# Patient Record
Sex: Female | Born: 1975 | Race: Black or African American | Hispanic: No | Marital: Married | Smoking: Former smoker
Health system: Southern US, Community
[De-identification: ages and names within clinical notes are randomized; demographics above are authoritative.]

## PROBLEM LIST (undated history)

## (undated) DIAGNOSIS — F419 Anxiety disorder, unspecified: Secondary | ICD-10-CM

## (undated) DIAGNOSIS — F329 Major depressive disorder, single episode, unspecified: Secondary | ICD-10-CM

## (undated) DIAGNOSIS — F32A Depression, unspecified: Secondary | ICD-10-CM

## (undated) DIAGNOSIS — D649 Anemia, unspecified: Secondary | ICD-10-CM

## (undated) DIAGNOSIS — O09529 Supervision of elderly multigravida, unspecified trimester: Secondary | ICD-10-CM

## (undated) HISTORY — DX: Major depressive disorder, single episode, unspecified: F32.9

## (undated) HISTORY — PX: THERAPEUTIC ABORTION: SHX798

## (undated) HISTORY — PX: DILATION AND CURETTAGE OF UTERUS: SHX78

## (undated) HISTORY — DX: Depression, unspecified: F32.A

## (undated) HISTORY — PX: WISDOM TOOTH EXTRACTION: SHX21

## (undated) HISTORY — DX: Anemia, unspecified: D64.9

## (undated) HISTORY — DX: Anxiety disorder, unspecified: F41.9

---

## 1998-11-07 ENCOUNTER — Other Ambulatory Visit: Admission: RE | Admit: 1998-11-07 | Discharge: 1998-11-07 | Payer: Self-pay | Admitting: Obstetrics and Gynecology

## 1999-04-30 ENCOUNTER — Inpatient Hospital Stay (HOSPITAL_COMMUNITY): Admission: AD | Admit: 1999-04-30 | Discharge: 1999-04-30 | Payer: Self-pay | Admitting: Obstetrics & Gynecology

## 1999-04-30 ENCOUNTER — Inpatient Hospital Stay (HOSPITAL_COMMUNITY): Admission: AD | Admit: 1999-04-30 | Discharge: 1999-04-30 | Payer: Self-pay | Admitting: *Deleted

## 1999-05-01 ENCOUNTER — Inpatient Hospital Stay (HOSPITAL_COMMUNITY): Admission: AD | Admit: 1999-05-01 | Discharge: 1999-05-04 | Payer: Self-pay | Admitting: Obstetrics & Gynecology

## 1999-11-08 ENCOUNTER — Emergency Department (HOSPITAL_COMMUNITY): Admission: EM | Admit: 1999-11-08 | Discharge: 1999-11-08 | Payer: Self-pay | Admitting: Emergency Medicine

## 2000-04-07 ENCOUNTER — Emergency Department (HOSPITAL_COMMUNITY): Admission: EM | Admit: 2000-04-07 | Discharge: 2000-04-07 | Payer: Self-pay | Admitting: Emergency Medicine

## 2000-04-25 ENCOUNTER — Other Ambulatory Visit: Admission: RE | Admit: 2000-04-25 | Discharge: 2000-04-25 | Payer: Self-pay | Admitting: Obstetrics

## 2000-04-25 ENCOUNTER — Encounter (INDEPENDENT_AMBULATORY_CARE_PROVIDER_SITE_OTHER): Payer: Self-pay

## 2000-07-28 ENCOUNTER — Emergency Department (HOSPITAL_COMMUNITY): Admission: EM | Admit: 2000-07-28 | Discharge: 2000-07-28 | Payer: Self-pay | Admitting: Emergency Medicine

## 2001-04-03 ENCOUNTER — Emergency Department (HOSPITAL_COMMUNITY): Admission: EM | Admit: 2001-04-03 | Discharge: 2001-04-04 | Payer: Self-pay | Admitting: Emergency Medicine

## 2002-01-01 ENCOUNTER — Ambulatory Visit (HOSPITAL_COMMUNITY): Admission: RE | Admit: 2002-01-01 | Discharge: 2002-01-01 | Payer: Self-pay | Admitting: Obstetrics and Gynecology

## 2002-01-01 ENCOUNTER — Encounter: Payer: Self-pay | Admitting: Obstetrics and Gynecology

## 2002-04-21 ENCOUNTER — Inpatient Hospital Stay (HOSPITAL_COMMUNITY): Admission: AD | Admit: 2002-04-21 | Discharge: 2002-04-24 | Payer: Self-pay | Admitting: Obstetrics and Gynecology

## 2003-01-17 ENCOUNTER — Other Ambulatory Visit: Admission: RE | Admit: 2003-01-17 | Discharge: 2003-01-17 | Payer: Self-pay | Admitting: Obstetrics and Gynecology

## 2003-11-22 ENCOUNTER — Emergency Department (HOSPITAL_COMMUNITY): Admission: EM | Admit: 2003-11-22 | Discharge: 2003-11-22 | Payer: Self-pay | Admitting: Family Medicine

## 2004-01-27 ENCOUNTER — Other Ambulatory Visit: Admission: RE | Admit: 2004-01-27 | Discharge: 2004-01-27 | Payer: Self-pay | Admitting: Obstetrics and Gynecology

## 2004-09-21 ENCOUNTER — Emergency Department (HOSPITAL_COMMUNITY): Admission: EM | Admit: 2004-09-21 | Discharge: 2004-09-21 | Payer: Self-pay | Admitting: *Deleted

## 2005-01-29 ENCOUNTER — Other Ambulatory Visit: Admission: RE | Admit: 2005-01-29 | Discharge: 2005-01-29 | Payer: Self-pay | Admitting: Obstetrics and Gynecology

## 2006-01-09 ENCOUNTER — Other Ambulatory Visit: Admission: RE | Admit: 2006-01-09 | Discharge: 2006-01-09 | Payer: Self-pay | Admitting: Obstetrics and Gynecology

## 2009-04-13 ENCOUNTER — Encounter: Admission: RE | Admit: 2009-04-13 | Discharge: 2009-04-13 | Payer: Self-pay | Admitting: Internal Medicine

## 2010-08-10 ENCOUNTER — Inpatient Hospital Stay (HOSPITAL_COMMUNITY): Admission: AD | Admit: 2010-08-10 | Discharge: 2010-08-11 | Payer: Self-pay | Admitting: Obstetrics and Gynecology

## 2010-12-18 LAB — POCT PREGNANCY, URINE: Preg Test, Ur: NEGATIVE

## 2010-12-18 LAB — CBC
HCT: 32.4 % — ABNORMAL LOW (ref 36.0–46.0)
Hemoglobin: 10.9 g/dL — ABNORMAL LOW (ref 12.0–15.0)
MCH: 29.6 pg (ref 26.0–34.0)
MCHC: 33.5 g/dL (ref 30.0–36.0)

## 2010-12-18 LAB — GC/CHLAMYDIA PROBE AMP, GENITAL
Chlamydia, DNA Probe: NEGATIVE
GC Probe Amp, Genital: NEGATIVE

## 2010-12-18 LAB — WET PREP, GENITAL

## 2011-02-22 NOTE — Op Note (Signed)
[facility]  Patient:    Rhonda Shannon, Rhonda Shannon Visit Number: [id number] MRN: [id number]          Service Type: OBS Location: 910A 9101 01 Attending Physician:  Leonard Schwartz Dictated by:   Janine Limbo, M.D. Proc. Date: 04/21/02 Admit Date:  04/21/2002                             Operative Report  PREOPERATIVE DIAGNOSES:       1. Term intrauterine pregnancy.                               2. Prior cesarean section.                               3. Desires repeat cesarean section.  POSTOPERATIVE DIAGNOSES:      1. Term intrauterine pregnancy.                               2. Prior cesarean section.                               3. Desires repeat cesarean section.  PROCEDURE:                    Repeat low transverse cesarean section.  SURGEON:                      Janine Limbo, M.D.  FIRST ASSISTANT:              Henreitta Leber, P.A.  ANESTHESIA:                   Spinal.  DISPOSITION:                  Ms. Montez Morita is a 35 year old female gravida 4, para 1-0-2-1 who presents at [redacted] weeks gestation for repeat cesarean section. She understands the indications for her procedure and she accepts the risks of, but not limited to, anesthetic complications, bleeding, infections, and possible damage to the surrounding organs.  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  The Apgars were 9 at one minute and 9 at five minutes.  There were two nuchal cords present.  The uterus, fallopian tubes, and ovaries were normal for the gravid state.  PROCEDURE:                    The patient was taken to the operating room where a spinal anesthetic was given.  The patients abdomen and perineum were prepped with multiple layers of Betadine.  A Foley catheter was placed.  The patient was sterilely draped.  A low transverse incision was made in the abdomen and carried sharply  through the subcutaneous tissue, the fascia, and the anterior peritoneum.  An incision was made in the lower uterine segment and extended transversely.  The fetal head was delivered without difficulty. The mouth and nose were suctioned.  The nuchal cord was clamped and cut.  The remainder of the  infant was delivered.  Routine cord blood studies were obtained.  The infant was handed to the waiting pediatric team.  The placenta was removed.  The uterine cavity was cleaned of amniotic fluid, clotted blood, and membranes.  The uterine incision was closed using a running locking suture of 2-0 Vicryl.  The pericolonic gutters were cleaned of amniotic fluid and clotted blood.  Hemostasis was adequate.  The anterior peritoneum and the abdominal musculature were reapproximated in the midline using 2-0 Vicryl. The subcutaneous layer and the fascia were irrigated.  Hemostasis was adequate.  The fascia was closed using a running suture of 0 Vicryl followed by three interrupted sutures of 0 Vicryl.  The subcutaneous layer was irrigated and hemostasis was adequate.  The skin was reapproximated using skin staples.  Sponge, needle, and instrument counts were correct on two occasions. The estimated blood loss was 700 cc.  The patient tolerated her procedure well.  The infant was taken to the full-term nursery in stable condition.  The patient was taken to the recovery room in stable condition. Dictated by:   Janine Limbo, M.D. Attending Physician:  Leonard Schwartz DD:  04/21/02 TD:  04/23/02 Job: [phone] [id number]

## 2011-02-22 NOTE — H&P (Signed)
[facility]  Patient:    Rhonda Shannon, Rhonda Shannon Visit Number: [id number] MRN: [id number]          Service Type: OBS Location: 910B 9198 01 Attending Physician:  Leonard Schwartz Dictated by:   Janine Limbo, M.D. Admit Date:  04/21/2002                           History and Physical  HISTORY OF PRESENT ILLNESS:   The patient is a 35 year old female, gravida 4, para 1-0-2-1, who presents at [redacted] weeks gestation (EDC is May 06, 2001) for repeat cesarean section.  The patient has been followed at [facility] Gynecology for this pregnancy that has been largely uncomplicated.  She did have a prior cesarean section in 2000 where she delivered an 8 pound, 6 ounce female infant at 41-1/[redacted] weeks gestation.  She had a failed vacuum extraction at that time.  OBSTETRICAL HISTORY;          The patient had a cesarean section in July 2000 and she had elective pregnancy terminations in 1997 and in 1998.  DRUG ALLERGIES:               None known.  PAST MEDICAL HISTORY:         The patient denies hypertension and diabetes. She does have a history of anemia, however.  SOCIAL HISTORY:               The patient denies cigarette use, alcohol use, and recreational drug use.  REVIEW OF SYSTEMS:            Normal pregnancy complaints.  FAMILY HISTORY:               The patient has a history of albinism in five relatives.  Her mother has thalassemia trait.  She has two grandmothers with non-insulin-dependent diabetes.  PHYSICAL EXAMINATION:  VITAL SIGNS:                  Weight is 139 pounds.  HEENT:                        Within normal limits.  CHEST:                        Clear.  HEART:                        Regular rate and rhythm.  BREASTS:                      Without masses.  ABDOMEN:                      Gravid with a fundal height of 36 cm.  EXTREMITIES:                  Within normal limits.  NEUROLOGIC:                    Exam is grossly normal.  PELVIC:                       Her cervix is closed and long.  LABORATORY VALUES:            Blood type is A positive, antibody screen negative.  VDRL is nonreactive.  Rubella is immune.  HBsAg negative.  HIV is nonreactive.  GC negative.  Chlamydia negative.  Pap within normal limits. Alpha-fetoprotein within normal limits.  Glucola screen is 77. Third-trimester beta strep is positive.  ASSESSMENT:                   1. A 38-week gestation.                               2. Prior cesarean section.                               3. Desires repeat cesarean section.  PLAN:                         The patient will undergo a repeat low transverse cesarean section.  She understands the indications for her procedure and she accepts the risks of, but not limited to, anesthetic complications, bleeding, infections, and possible damage to the surrounding organs. Dictated by:   Janine Limbo, M.D. Attending Physician:  Leonard Schwartz DD:  04/20/02 TD:  04/20/02 Job: [id number]

## 2011-02-22 NOTE — Discharge Summary (Signed)
[facility]  Patient:    Rhonda Shannon, Rhonda Shannon Visit Number: [id number] MRN: [id number]          Service Type: OBS Location: 910A 9101 01 Attending Physician:  Leonard Schwartz Dictated by:   Wynelle Bourgeois, CNM Admit Date:  04/21/2002 Discharge Date: 04/24/2002                             Discharge Summary  ADMISSION DIAGNOSES:          1. Intrauterine pregnancy at 38 weeks.                               2. Previous cesarean section, desires                                  repeat cesarean section.  DISCHARGE DIAGNOSES:          1. Intrauterine pregnancy at 38 weeks.                               2. Previous cesarean section, desires                                  repeat cesarean section.                               3. Status post low transverse cesarean section                                  of a 7 pound 10 ounce female, named [place],                                  Apgars 9 and 9 with nuchal cord x2.                               4. Anemia without hemodynamic instability.  HOSPITAL PROCEDURES:          1. Spinal anesthesia.                               2. Repeat low transverse cesarean section.  HOSPITAL COURSE:              The patient was admitted on April 21, 2002, for an elective repeat low transverse cesarean section and was delivered successfully of a viable female infant with two nuchal cords who was vigorous upon birth.  Surgery went without incident with an EBL of 700 cc. Postoperative day #1, the patient was doing well, ambulating and eating without difficulty.  She had chosen to bottle feed her infant.  She was planning and IUD for contraception.  Vital signs are stable.  Hemoglobin was 7.1, but the patient was without syncope or dizziness.  Abdomen was soft and nontender.  Dressing was clean, dry and intact and the patient continued with routine postoperative care on days #1, 2  and 3.  On postoperative day #2, a CBC  was repeated which returned a hemoglobin of 7.9 which was an improvement. The patient continued to be up and around with stable vital signs and no evidence of syncope or dizziness.  On postoperative day #3, she continued to be stable.  Physical exam was within normal limits.  Abdomen was soft and appropriately tender.  Incision was clean and dry with staples.  Lochia was small.  Extremities within normal limits.  She was deemed to have received the full benefit of her hospital stay and was discharged home.  DISCHARGE LABORATORY DATA:    White blood cell count 7.3, hemoglobin 7.9, hematocrit 24.2, platelets 189,000.  RPR nonreactive.  DISCHARGE MEDICATIONS:        1. Tylox one to two p.o. q.4h. p.r.n.                               2. Motrin 600 mg p.o. q.6h. p.r.n.                               3. Iron sulfate one p.o. b.i.d.  DISCHARGE INSTRUCTIONS:       Per [facility] handout.  DISCHARGE FOLLOWUP:           Six weeks or p.r.n. Dictated by:   Wynelle Bourgeois, CNM Attending Physician:  Leonard Schwartz DD:  04/14/02 TD:  04/29/02 Job: [id number]

## 2011-11-28 ENCOUNTER — Ambulatory Visit (INDEPENDENT_AMBULATORY_CARE_PROVIDER_SITE_OTHER): Payer: BC Managed Care – PPO | Admitting: Emergency Medicine

## 2011-11-28 DIAGNOSIS — R1110 Vomiting, unspecified: Secondary | ICD-10-CM

## 2011-11-28 DIAGNOSIS — R109 Unspecified abdominal pain: Secondary | ICD-10-CM

## 2011-11-28 LAB — POCT CBC
Granulocyte percent: 62.5 %G (ref 37–80)
HCT, POC: 37.1 % — AB (ref 37.7–47.9)
MCV: 87.2 fL (ref 80–97)
Platelet Count, POC: 246 10*3/uL (ref 142–424)
RBC: 4.25 M/uL (ref 4.04–5.48)

## 2011-11-28 LAB — POCT URINALYSIS DIPSTICK
Ketones, UA: NEGATIVE
Spec Grav, UA: >=1.03
pH, UA: 5

## 2011-11-28 LAB — POCT UA - MICROSCOPIC ONLY
Casts, Ur, LPF, POC: NEGATIVE
Yeast, UA: NEGATIVE

## 2011-11-28 MED ORDER — ONDANSETRON 8 MG PO TBDP: 8.0000 mg | ORAL_TABLET | Freq: Three times a day (TID) | ORAL | Status: AC | PRN

## 2011-11-28 NOTE — Patient Instructions (Signed)
Nausea and Vomiting Nausea is a sick feeling that often comes before throwing up (vomiting). Vomiting is a reflex where stomach contents come out of your mouth. Vomiting can cause severe loss of body fluids (dehydration). Children and elderly adults can become dehydrated quickly, especially if they also have diarrhea. Nausea and vomiting are symptoms of a condition or disease. It is important to find the cause of your symptoms. CAUSES   Direct irritation of the stomach lining. This irritation can result from increased acid production (gastroesophageal reflux disease), infection, food poisoning, taking certain medicines (such as nonsteroidal anti-inflammatory drugs), alcohol use, or tobacco use.   Signals from the brain.These signals could be caused by a headache, heat exposure, an inner ear disturbance, increased pressure in the brain from injury, infection, a tumor, or a concussion, pain, emotional stimulus, or metabolic problems.   An obstruction in the gastrointestinal tract (bowel obstruction).   Illnesses such as diabetes, hepatitis, gallbladder problems, appendicitis, kidney problems, cancer, sepsis, atypical symptoms of a heart attack, or eating disorders.   Medical treatments such as chemotherapy and radiation.   Receiving medicine that makes you sleep (general anesthetic) during surgery.  DIAGNOSIS Your caregiver may ask for tests to be done if the problems do not improve after a few days. Tests may also be done if symptoms are severe or if the reason for the nausea and vomiting is not clear. Tests may include:  Urine tests.   Blood tests.   Stool tests.   Cultures (to look for evidence of infection).   X-rays or other imaging studies.  Test results can help your caregiver make decisions about treatment or the need for additional tests. TREATMENT You need to stay well hydrated. Drink frequently but in small amounts.You may wish to drink water, sports drinks, clear broth, or  eat frozen ice pops or gelatin dessert to help stay hydrated.When you eat, eating slowly may help prevent nausea.There are also some antinausea medicines that may help prevent nausea. HOME CARE INSTRUCTIONS   Take all medicine as directed by your caregiver.   If you do not have an appetite, do not force yourself to eat. However, you must continue to drink fluids.   If you have an appetite, eat a normal diet unless your caregiver tells you differently.   Eat a variety of complex carbohydrates (rice, wheat, potatoes, bread), lean meats, yogurt, fruits, and vegetables.   Avoid high-fat foods because they are more difficult to digest.   Drink enough water and fluids to keep your urine clear or pale yellow.   If you are dehydrated, ask your caregiver for specific rehydration instructions. Signs of dehydration may include:   Severe thirst.   Dry lips and mouth.   Dizziness.   Dark urine.   Decreasing urine frequency and amount.   Confusion.   Rapid breathing or pulse.  SEEK IMMEDIATE MEDICAL CARE IF:   You have blood or brown flecks (like coffee grounds) in your vomit.   You have black or bloody stools.   You have a severe headache or stiff neck.   You are confused.   You have severe abdominal pain.   You have chest pain or trouble breathing.   You do not urinate at least once every 8 hours.   You develop cold or clammy skin.   You continue to vomit for longer than 24 to 48 hours.   You have a fever.  MAKE SURE YOU:   Understand these instructions.   Will watch your   condition.   Will get help right away if you are not doing well or get worse.  Document Released: 09/23/2005 Document Revised: 06/05/2011 Document Reviewed: 02/20/2011 Exit[employer] Patient Information 2012 [employer]. 

## 2011-11-28 NOTE — Progress Notes (Signed)
  Subjective:    Patient ID: Rhonda Shannon, female    DOB: 08/06/1976, 36 y.o.   MRN: [id number]  HPI patient presents with onset of nausea and vomiting yesterday. She's also had some flank and abdominal discomfort. She has had no definite fever. . The very first of the month. Has some mild burning on urination but nothing significant.    Review of Systems she has felt lightheaded and nauseated. She had one loose stool yesterday but none today.     Objective:   Physical Exam physical exam reveals an alert female who is in no acute distress. Her chest is clear to auscultation and percussion. Heart regular rate no murmurs rubs or gallops. The abdomen is flat there is tenderness to palpation in the midepigastrium and also on the right and left lower abdomen.        Assessment & Plan:  Assessment is abdominal pain nausea loose stools with flank pain urinary symptoms. He did rule out a kidney infection. This could be a GI bug.

## 2012-05-07 ENCOUNTER — Encounter: Payer: Self-pay | Admitting: Family Medicine

## 2012-05-07 ENCOUNTER — Ambulatory Visit (INDEPENDENT_AMBULATORY_CARE_PROVIDER_SITE_OTHER): Payer: BC Managed Care – PPO | Admitting: Family Medicine

## 2012-05-07 VITALS — BP 116/76 | HR 76 | Temp 97.7°F | Resp 16 | Ht 63.75 in | Wt 136.6 lb

## 2012-05-07 DIAGNOSIS — Z0000 Encounter for general adult medical examination without abnormal findings: Secondary | ICD-10-CM

## 2012-05-07 DIAGNOSIS — F988 Other specified behavioral and emotional disorders with onset usually occurring in childhood and adolescence: Secondary | ICD-10-CM

## 2012-05-07 DIAGNOSIS — M2550 Pain in unspecified joint: Secondary | ICD-10-CM

## 2012-05-07 DIAGNOSIS — Z862 Personal history of diseases of the blood and blood-forming organs and certain disorders involving the immune mechanism: Secondary | ICD-10-CM

## 2012-05-07 DIAGNOSIS — Z124 Encounter for screening for malignant neoplasm of cervix: Secondary | ICD-10-CM

## 2012-05-07 DIAGNOSIS — Z8659 Personal history of other mental and behavioral disorders: Secondary | ICD-10-CM

## 2012-05-07 DIAGNOSIS — E559 Vitamin D deficiency, unspecified: Secondary | ICD-10-CM

## 2012-05-07 DIAGNOSIS — Z87891 Personal history of nicotine dependence: Secondary | ICD-10-CM

## 2012-05-07 DIAGNOSIS — Z01419 Encounter for gynecological examination (general) (routine) without abnormal findings: Secondary | ICD-10-CM

## 2012-05-07 LAB — BASIC METABOLIC PANEL
Calcium: 9.7 mg/dL (ref 8.4–10.5)
Potassium: 4.3 mEq/L (ref 3.5–5.3)
Sodium: 137 mEq/L (ref 135–145)

## 2012-05-07 LAB — CBC WITH DIFFERENTIAL/PLATELET
Basophils Absolute: 0 10*3/uL (ref 0.0–0.1)
Basophils Relative: 0 % (ref 0–1)
Hemoglobin: 11.1 g/dL — ABNORMAL LOW (ref 12.0–15.0)
MCHC: 33.4 g/dL (ref 30.0–36.0)
Monocytes Relative: 6 % (ref 3–12)
Neutro Abs: 1.6 10*3/uL — ABNORMAL LOW (ref 1.7–7.7)
Neutrophils Relative %: 52 % (ref 43–77)
Platelets: 267 10*3/uL (ref 150–400)

## 2012-05-07 LAB — POCT URINALYSIS DIPSTICK
Bilirubin, UA: NEGATIVE
Ketones, UA: NEGATIVE
Nitrite, UA: NEGATIVE
Protein, UA: NEGATIVE

## 2012-05-07 NOTE — Progress Notes (Signed)
Subjective:    Patient ID: Rhonda Shannon, female    DOB: 09-Jun-1976, 36 y.o.   MRN: [id number]  HPI This 36 y.o. AA female is here for CPE/PAP. She has been treated for anxiety and depression  in the 2005- 2006 but these are not activity problems. She is concerned that she may have ADHD.  She recalls "struggling a little" through college and has problems with concentration. She has to  make to-do lists and often procrastinates. She has a child who has ADHD.  (She will return for another visit to address this issue more thoroughly).    She is a Runner, broadcasting/film/video and is a single, monogamous heterosexual female whose partner is HSV II + and  takes "medication". She quit smoking in 2011 and consumes alcohol 2-3x per week. She exercises  regularly.   Last PAP: Nov 2011(normal)  Last eye exam: July 2013    Review of Systems  Constitutional: Negative.   Eyes: Negative.   Respiratory: Negative.   Cardiovascular: Negative.   Gastrointestinal: Negative.   Genitourinary: Positive for urgency.  Musculoskeletal: Positive for arthralgias and gait problem.       Pain in knees  Skin: Positive for rash.       Scaly rash on elbows only- Psoriasis?  Neurological: Positive for headaches.  Hematological: Negative.   Psychiatric/Behavioral: Positive for confusion and decreased concentration. The patient is hyperactive.        Confusion-forgetful       Objective:   Physical Exam  Nursing note and vitals reviewed. Constitutional: She is oriented to person, place, and time. She appears well-developed and well-nourished. No distress.  HENT:  Head: Normocephalic and atraumatic.  Right Ear: Hearing, tympanic membrane, external ear and ear canal normal.  Left Ear: Hearing, tympanic membrane, external ear and ear canal normal.  Nose: Nose normal. No nasal deformity or septal deviation.  Mouth/Throat: Uvula is midline, oropharynx is clear and moist and mucous membranes are normal. No oral lesions. Normal  dentition. No dental caries.  Eyes: Conjunctivae are normal. Pupils are equal, round, and reactive to light. No scleral icterus.  Neck: Normal range of motion. Neck supple. No thyromegaly present.  Cardiovascular: Normal rate and regular rhythm.  Exam reveals no gallop and no friction rub.   No murmur heard. Pulmonary/Chest: Effort normal and breath sounds normal. No respiratory distress.  Abdominal: Soft. Normal appearance and bowel sounds are normal. She exhibits no mass. There is no hepatosplenomegaly. There is no tenderness. There is no guarding and no CVA tenderness.  Genitourinary: Vagina normal and uterus normal. Rectal exam shows no external hemorrhoid. No breast swelling, tenderness, discharge or bleeding. There is no rash, tenderness or lesion on the right labia. There is no rash, tenderness or lesion on the left labia. Cervix exhibits no motion tenderness, no discharge and no friability. Right adnexum displays no mass, no tenderness and no fullness. Left adnexum displays no mass, no tenderness and no fullness. No erythema, tenderness or bleeding around the vagina. No vaginal discharge found.  Musculoskeletal: Normal range of motion. She exhibits no edema and no tenderness.  Lymphadenopathy:    She has no cervical adenopathy.       Right: No inguinal adenopathy present.       Left: No inguinal adenopathy present.  Neurological: She is alert and oriented to person, place, and time. She has normal reflexes. No cranial nerve deficit. She exhibits normal muscle tone. Coordination normal.  Skin: Skin is warm and dry. Rash noted. No erythema.  Elbows- mild scaling without significant discoloration  Psychiatric: She has a normal mood and affect. Her behavior is normal. Judgment and thought content normal.         Assessment & Plan:   1. Routine general medical examination at a health care facility  POCT urinalysis dipstick  2. Unspecified vitamin D deficiency  Vitamin D, 25-hydroxy,  Basic metabolic panel  3. History of anemia  CBC with Differential  4. Encounter for cervical Pap smear with pelvic exam  Pap IG, CT/NG w/ reflex HPV when ASC-U  5. Joint pain - exam negative today Basic metabolic panel  6. ADD (attention deficit disorder)  Pt to RTC for visit to evaluate further    Re: rash on elbows- pt advised to use OTC cortisone product or ask Pharmacist to recommend an appropriate product.

## 2012-05-07 NOTE — Patient Instructions (Signed)
Keeping You Healthy  Get These Tests 1. Blood Pressure- Have your blood pressure checked once a year by your health care provider.  Normal blood pressure is 120/80. 2. Weight- Have your body mass index (BMI) calculated to screen for obesity.  BMI is measure of body fat based on height and weight.  You can also calculate your own BMI at https://www.west-esparza.com/. 3. Cholesterol- Have your cholesterol checked every 5 years starting at age 35 then yearly starting at age 29. 4. Chlamydia, HIV, and other sexually transmitted diseases- Get screened every year until age 4, then within three months of each new sexual provider. 5. Pap Smear- Every 1-3 years; discuss with your health care provider. 6. Mammogram- Every year starting at age 45  Take these medicines  Calcium with Vitamin D-Your body needs 1200 mg of Calcium each day and [phone] IU of Vitamin D daily.  Your body can only absorb 500 mg of Calcium at a time so Calcium must be taken in 2 or 3 divided doses throughout the day.  Multivitamin with folic acid- Once daily if it is possible for you to become pregnant.  Get these Immunizations  Gardasil-Series of three doses; prevents HPV related illness such as genital warts and cervical cancer.  Menactra-Single dose; prevents meningitis.  Tetanus shot- Every 10 years.   Check to see if you have had Tdap.  Flu shot-Every year.  Take these steps 1. Do not smoke-Your healthcare provider can help you quit.  For tips on how to quit go to www.smokefree.gov or call 1-800 QUITNOW. 2. Be physically active- Exercise 5 days a week for at least 30 minutes.  If you are not already physically active, start slow and gradually work up to 30 minutes of moderate physical activity.  Examples of moderate activity include walking briskly, dancing, swimming, bicycling, etc. 3. Breast Cancer- A self breast exam every month is important for early detection of breast cancer.  For more information and instruction on  self breast exams, ask your healthcare provider or [place]. 4. Eat a healthy diet- Eat a variety of healthy foods such as fruits, vegetables, whole grains, low fat milk, low fat cheeses, yogurt, lean meats, poultry and fish, beans, nuts, tofu, etc.  For more information go to www. Thenutritionsource.org 5. Drink alcohol in moderation- Limit alcohol intake to one drink or less per day. Never drink and drive. 6. Depression- Your emotional health is as important as your physical health.  If you're feeling down or losing interest in things you normally enjoy please talk to your healthcare provider about being screened for depression. 7. Dental visit- Brush and floss your teeth twice daily; visit your dentist twice a year. 8. Eye doctor- Get an eye exam at least every 2 years. 9. Helmet use- Always wear a helmet when riding a bicycle, motorcycle, rollerblading or skateboarding. 10. Safe sex- If you may be exposed to sexually transmitted infections, use a condom. 11. Seat belts- Seat belts can save your live; always wear one. 12. Smoke/Carbon Monoxide detectors- These detectors need to be installed on the appropriate level of your home. Replace batteries at least once a year. 13. Skin cancer- When out in the sun please cover up and use sunscreen 15 SPF or higher. 14. Violence- If anyone is threatening or hurting you, please tell your healthcare provider.

## 2012-05-08 LAB — VITAMIN D 25 HYDROXY (VIT D DEFICIENCY, FRACTURES): Vit D, 25-Hydroxy: 25 ng/mL — ABNORMAL LOW (ref 30–89)

## 2012-05-11 ENCOUNTER — Encounter: Payer: Self-pay | Admitting: Family Medicine

## 2012-05-11 DIAGNOSIS — Z8659 Personal history of other mental and behavioral disorders: Secondary | ICD-10-CM | POA: Insufficient documentation

## 2012-05-11 DIAGNOSIS — E559 Vitamin D deficiency, unspecified: Secondary | ICD-10-CM | POA: Insufficient documentation

## 2012-05-11 DIAGNOSIS — Z87891 Personal history of nicotine dependence: Secondary | ICD-10-CM | POA: Insufficient documentation

## 2012-05-11 DIAGNOSIS — Z862 Personal history of diseases of the blood and blood-forming organs and certain disorders involving the immune mechanism: Secondary | ICD-10-CM | POA: Insufficient documentation

## 2012-05-11 LAB — PAP IG, CT-NG, RFX HPV ASCU: Chlamydia Probe Amp: NEGATIVE

## 2012-05-11 NOTE — Progress Notes (Signed)
Quick Note:  Please call pt and advise that the following labs are abnormal... Your Vitamin D level is below normal; get over-the-counter Vit D3 2000 IU and take 1 capsule daily. Also try to eat more Vitamin D-rich foods and get some sun exposure most days of the week You are mildly anemic so a daily multivitamin with iron once a day with largest meal would be helpful.  Your PAP is normal/ negative and STD results are negative. Your blood sugar and kidney tests are all normal.  Copy to pt. ______

## 2012-05-27 ENCOUNTER — Ambulatory Visit: Payer: BC Managed Care – PPO | Admitting: Family Medicine

## 2012-07-10 ENCOUNTER — Ambulatory Visit (INDEPENDENT_AMBULATORY_CARE_PROVIDER_SITE_OTHER): Payer: BC Managed Care – PPO | Admitting: Physician Assistant

## 2012-07-10 VITALS — BP 144/90 | HR 72 | Temp 98.3°F | Resp 18 | Ht 64.0 in | Wt 140.0 lb

## 2012-07-10 DIAGNOSIS — N390 Urinary tract infection, site not specified: Secondary | ICD-10-CM

## 2012-07-10 DIAGNOSIS — R3915 Urgency of urination: Secondary | ICD-10-CM

## 2012-07-10 LAB — POCT URINALYSIS DIPSTICK
Blood, UA: NEGATIVE
Ketones, UA: NEGATIVE
Protein, UA: NEGATIVE
Spec Grav, UA: 1.02
Urobilinogen, UA: 0.2

## 2012-07-10 LAB — POCT UA - MICROSCOPIC ONLY
Crystals, Ur, HPF, POC: NEGATIVE
Mucus, UA: NEGATIVE
Yeast, UA: NEGATIVE

## 2012-07-10 MED ORDER — PHENAZOPYRIDINE HCL 200 MG PO TABS: 200.0000 mg | ORAL_TABLET | Freq: Three times a day (TID) | ORAL | Status: DC | PRN

## 2012-07-10 MED ORDER — NITROFURANTOIN MACROCRYSTAL 100 MG PO CAPS: 100.0000 mg | ORAL_CAPSULE | Freq: Four times a day (QID) | ORAL | Status: DC

## 2012-07-10 MED ORDER — NITROFURANTOIN MACROCRYSTAL 100 MG PO CAPS: 100.0000 mg | ORAL_CAPSULE | Freq: Two times a day (BID) | ORAL | Status: DC

## 2012-07-10 NOTE — Addendum Note (Signed)
Addended by: Morrell Riddle on: 07/10/2012 07:10 PM   Modules accepted: Orders

## 2012-07-10 NOTE — Progress Notes (Signed)
   [address], [place] [address]   Phone [phone]  Subjective:    Patient ID: Rhonda Shannon, female    DOB: [birth year], 36 y.o.   MRN: [id number]  HPI  Pt presents to clinic with 1 wk h/o urinary urgency and frequency.  Pt states she is bad about going to the bathroom when she feels the urge but this is not like her normal urge.  In fact she has had a couple of accidents this week because she has not been able to get to the bathroom quick enough.  She has no dysuria, vaginal symptoms or change in sexual partners.  Review of Systems  Constitutional: Negative for fever and chills.  Gastrointestinal: Positive for abdominal pain (pressure). Negative for nausea and diarrhea.  Genitourinary: Positive for urgency and frequency. Negative for dysuria, hematuria, vaginal discharge and vaginal pain.  Musculoskeletal: Negative for back pain.       Objective:   Physical Exam  Vitals reviewed. Constitutional: She is oriented to person, place, and time. She appears well-developed and well-nourished.  HENT:  Head: Normocephalic and atraumatic.  Right Ear: External ear normal.  Left Ear: External ear normal.  Cardiovascular: Normal rate, regular rhythm and normal heart sounds.   No murmur heard. Pulmonary/Chest: Effort normal and breath sounds normal.  Abdominal: Soft. Bowel sounds are normal. She exhibits no distension. There is tenderness (suprapubic). There is no CVA tenderness.  Neurological: She is alert and oriented to person, place, and time.  Skin: Skin is warm and dry.  Psychiatric: She has a normal mood and affect. Her behavior is normal. Judgment and thought content normal.    Results for orders placed in visit on 07/10/12  POCT URINALYSIS DIPSTICK      Component Value Range   Color, UA yellow     Clarity, UA cloudy     Glucose, UA negative     Bilirubin, UA negative     Ketones, UA negative     Spec Grav, UA 1.020     Blood, UA negative     pH, UA 7.5     Protein,  UA negative     Urobilinogen, UA 0.2     Nitrite, UA negative     Leukocytes, UA small (1+)    POCT UA - MICROSCOPIC ONLY      Component Value Range   WBC, Ur, HPF, POC 1-9     RBC, urine, microscopic 0-1     Bacteria, U Microscopic negative     Mucus, UA negative     Epithelial cells, urine per micros 0-2     Crystals, Ur, HPF, POC negative     Casts, Ur, LPF, POC negative     Yeast, UA negative           Assessment & Plan:   1. UTI (lower urinary tract infection)  nitrofurantoin (MACRODANTIN) 100 MG capsule, phenazopyridine (PYRIDIUM) 200 MG tablet, Urine culture  2. Urinary urgency  POCT urinalysis dipstick, POCT UA - Microscopic Only   Pt to push fluids.  Will send UA cx but if pt is not improved after abx she will RTC for recheck.

## 2012-07-15 LAB — URINE CULTURE

## 2012-07-17 ENCOUNTER — Encounter: Payer: Self-pay | Admitting: *Deleted

## 2014-10-25 ENCOUNTER — Ambulatory Visit (INDEPENDENT_AMBULATORY_CARE_PROVIDER_SITE_OTHER): Payer: BC Managed Care – PPO | Admitting: Family Medicine

## 2014-10-25 ENCOUNTER — Encounter: Payer: Self-pay | Admitting: Family Medicine

## 2014-10-25 VITALS — BP 120/78 | HR 79 | Temp 97.9°F | Resp 16 | Ht 63.5 in | Wt 147.2 lb

## 2014-10-25 DIAGNOSIS — Z124 Encounter for screening for malignant neoplasm of cervix: Secondary | ICD-10-CM

## 2014-10-25 DIAGNOSIS — Z862 Personal history of diseases of the blood and blood-forming organs and certain disorders involving the immune mechanism: Secondary | ICD-10-CM

## 2014-10-25 DIAGNOSIS — R350 Frequency of micturition: Secondary | ICD-10-CM

## 2014-10-25 DIAGNOSIS — Z113 Encounter for screening for infections with a predominantly sexual mode of transmission: Secondary | ICD-10-CM

## 2014-10-25 DIAGNOSIS — Z30011 Encounter for initial prescription of contraceptive pills: Secondary | ICD-10-CM

## 2014-10-25 DIAGNOSIS — Z0000 Encounter for general adult medical examination without abnormal findings: Secondary | ICD-10-CM

## 2014-10-25 DIAGNOSIS — Z1239 Encounter for other screening for malignant neoplasm of breast: Secondary | ICD-10-CM

## 2014-10-25 DIAGNOSIS — Z13220 Encounter for screening for lipoid disorders: Secondary | ICD-10-CM

## 2014-10-25 DIAGNOSIS — Z23 Encounter for immunization: Secondary | ICD-10-CM

## 2014-10-25 DIAGNOSIS — Z01419 Encounter for gynecological examination (general) (routine) without abnormal findings: Secondary | ICD-10-CM

## 2014-10-25 LAB — CBC WITH DIFFERENTIAL/PLATELET
BASOS ABS: 0 10*3/uL (ref 0.0–0.1)
BASOS PCT: 1 % (ref 0–1)
EOS ABS: 0 10*3/uL (ref 0.0–0.7)
Eosinophils Relative: 1 % (ref 0–5)
HEMATOCRIT: 35.6 % — AB (ref 36.0–46.0)
Hemoglobin: 11.6 g/dL — ABNORMAL LOW (ref 12.0–15.0)
LYMPHS ABS: 1.8 10*3/uL (ref 0.7–4.0)
Lymphocytes Relative: 43 % (ref 12–46)
MCH: 28.4 pg (ref 26.0–34.0)
MCHC: 32.6 g/dL (ref 30.0–36.0)
MCV: 87 fL (ref 78.0–100.0)
MONO ABS: 0.2 10*3/uL (ref 0.1–1.0)
MPV: 9.7 fL (ref 8.6–12.4)
Monocytes Relative: 6 % (ref 3–12)
NEUTROS ABS: 2 10*3/uL (ref 1.7–7.7)
NEUTROS PCT: 49 % (ref 43–77)
Platelets: 349 10*3/uL (ref 150–400)
RBC: 4.09 MIL/uL (ref 3.87–5.11)
RDW: 12.9 % (ref 11.5–15.5)
WBC: 4.1 10*3/uL (ref 4.0–10.5)

## 2014-10-25 LAB — COMPLETE METABOLIC PANEL WITH GFR
ALBUMIN: 4.5 g/dL (ref 3.5–5.2)
ALT: 10 U/L (ref 0–35)
AST: 15 U/L (ref 0–37)
Alkaline Phosphatase: 44 U/L (ref 39–117)
BUN: 10 mg/dL (ref 6–23)
CALCIUM: 9.6 mg/dL (ref 8.4–10.5)
CHLORIDE: 101 meq/L (ref 96–112)
CO2: 25 mEq/L (ref 19–32)
CREATININE: 0.62 mg/dL (ref 0.50–1.10)
GFR, Est African American: >89 mL/min
GFR, Est Non African American: >89 mL/min
GLUCOSE: 77 mg/dL (ref 70–99)
POTASSIUM: 4 meq/L (ref 3.5–5.3)
Sodium: 137 mEq/L (ref 135–145)
Total Bilirubin: 0.5 mg/dL (ref 0.2–1.2)
Total Protein: 7.4 g/dL (ref 6.0–8.3)

## 2014-10-25 LAB — POCT URINALYSIS DIPSTICK
Bilirubin, UA: NEGATIVE
Blood, UA: NEGATIVE
GLUCOSE UA: NEGATIVE
Ketones, UA: NEGATIVE
Leukocytes, UA: NEGATIVE
Nitrite, UA: NEGATIVE
PH UA: 5
PROTEIN UA: NEGATIVE
Spec Grav, UA: <=1.005
UROBILINOGEN UA: 0.2

## 2014-10-25 LAB — LIPID PANEL
Cholesterol: 162 mg/dL (ref 0–200)
HDL: 57 mg/dL (ref >39)
LDL Cholesterol: 92 mg/dL (ref 0–99)
TRIGLYCERIDES: 63 mg/dL (ref <150)
Total CHOL/HDL Ratio: 2.8 Ratio
VLDL: 13 mg/dL (ref 0–40)

## 2014-10-25 MED ORDER — NORGESTIM-ETH ESTRAD TRIPHASIC 0.18/0.215/0.25 MG-35 MCG PO TABS: 1.0000 | ORAL_TABLET | Freq: Every day | ORAL | Status: DC

## 2014-10-25 NOTE — Patient Instructions (Signed)
Keeping You Healthy  Get These Tests  Blood Pressure- Have your blood pressure checked once a year by your health care provider.  Normal blood pressure is 120/80.  Weight- Have your body mass index (BMI) calculated to screen for obesity.  BMI is measure of body fat based on height and weight.  You can also calculate your own BMI at https://www.west-esparza.com/www.nhlbisupport.com/bmi/.  Cholesterol- Have your cholesterol checked every 5 years starting at age 38 then yearly starting at age 38.  Chlamydia, HIV, and other sexually transmitted diseases- Get screened every year until age 38, then within three months of each new sexual provider.  Pap Smear- Every 1-3 years; discuss with your health care provider.  Mammogram- Every year starting at age 38. I am ordering a baseline mammogram [facility].   Take these medicines  Calcium with Vitamin D-Your body needs 1200 mg of Calcium each day and [phone] IU of Vitamin D daily.  Your body can only absorb 500 mg of Calcium at a time so Calcium must be taken in 2 or 3 divided doses throughout the day.  Multivitamin with folic acid- Once daily if it is possible for you to become pregnant.  Get these Immunizations  Tetanus shot- Every 10 years.  Flu shot-Every year.  Take these steps 1. Do not smoke-Your healthcare provider can help you quit.  For tips on how to quit go to www.smokefree.gov or call 1-800 QUITNOW. 2. Be physically active- Exercise 5 days a week for at least 30 minutes.  If you are not already physically active, start slow and gradually work up to 30 minutes of moderate physical activity.  Examples of moderate activity include walking briskly, dancing, swimming, bicycling, etc. 3. Breast Cancer- A self breast exam every month is important for early detection of breast cancer.  For more information and instruction on self breast exams, ask your healthcare provider or [place].eswww.womenshealth.gov/faq/breast-self-exam.cfm. 4. Eat a healthy diet-  Eat a variety of healthy foods such as fruits, vegetables, whole grains, low fat milk, low fat cheeses, yogurt, lean meats, poultry and fish, beans, nuts, tofu, etc.  For more information go to www. Thenutritionsource.org 5. Drink alcohol in moderation- Limit alcohol intake to one drink or less per day. Never drink and drive. 6. Depression- Your emotional health is as important as your physical health.  If you're feeling down or losing interest in things you normally enjoy please talk to your healthcare provider about being screened for depression. 7. Dental visit- Brush and floss your teeth twice daily; visit your dentist twice a year. 8. Eye doctor- Get an eye exam at least every 2 years. 9. Helmet use- Always wear a helmet when riding a bicycle, motorcycle, rollerblading or skateboarding. 10. Safe sex- If you may be exposed to sexually transmitted infections, use a condom. 11. Seat belts- Seat belts can save your live; always wear one. 12. Smoke/Carbon Monoxide detectors- These detectors need to be installed on the appropriate level of your home. Replace batteries at least once a year. 13. Skin cancer- When out in the sun please cover up and use sunscreen 15 SPF or higher. 14. Violence- If anyone is threatening or hurting you, please tell your healthcare provider.   Oral Contraception Information Oral contraceptive pills (OCPs) are medicines taken to prevent pregnancy. OCPs work by preventing the ovaries from releasing eggs. The hormones in OCPs also cause the cervical mucus to thicken, preventing the sperm from entering the uterus. The hormones also cause the uterine  lining to become thin, not allowing a fertilized egg to attach to the inside of the uterus. OCPs are highly effective when taken exactly as prescribed. However, OCPs do not prevent sexually transmitted diseases (STDs). Safe sex practices, such as using condoms along with the pill, can help prevent STDs.  Before taking the pill, you  may have a physical exam and Pap test. Your health care provider may order blood tests. The health care provider will make sure you are a good candidate for oral contraception. Discuss with your health care provider the possible side effects of the OCP you may be prescribed. When starting an OCP, it can take 2 to 3 months for the body to adjust to the changes in hormone levels in your body.  TYPES OF ORAL CONTRACEPTION  The combination pill--This pill contains estrogen and progestin (synthetic progesterone) hormones. The combination pill comes in 21-day, 28-day, or 91-day packs. Some types of combination pills are meant to be taken continuously (365-day pills). With 21-day packs, you do not take pills for 7 days after the last pill. With 28-day packs, the pill is taken every day. The last 7 pills are without hormones. Certain types of pills have more than 21 hormone-containing pills. With 91-day packs, the first 84 pills contain both hormones, and the last 7 pills contain no hormones or contain estrogen only.  The minipill--This pill contains the progesterone hormone only. The pill is taken every day continuously. It is very important to take the pill at the same time each day. The minipill comes in packs of 28 pills. All 28 pills contain the hormone.  ADVANTAGES OF ORAL CONTRACEPTIVE PILLS  Decreases premenstrual symptoms.   Treats menstrual period cramps.   Regulates the menstrual cycle.   Decreases a heavy menstrual flow.   May treatacne, depending on the type of pill.   Treats abnormal uterine bleeding.   Treats polycystic ovarian syndrome.   Treats endometriosis.   Can be used as emergency contraception.  THINGS THAT CAN MAKE ORAL CONTRACEPTIVE PILLS LESS EFFECTIVE OCPs can be less effective if:   You forget to take the pill at the same time every day.   You have a stomach or intestinal disease that lessens the absorption of the pill.   You take OCPs with other  medicines that make OCPs less effective, such as antibiotics, certain HIV medicines, and some seizure medicines.   You take expired OCPs.   You forget to restart the pill on day 7, when using the packs of 21 pills.  RISKS ASSOCIATED WITH ORAL CONTRACEPTIVE PILLS  Oral contraceptive pills can sometimes cause side effects, such as:  Headache.  Nausea.  Breast tenderness.  Irregular bleeding or spotting. Combination pills are also associated with a small increased risk of:  Blood clots.  Heart attack.  Stroke. Document Released: 12/14/2002 Document Revised: 07/14/2013 Document Reviewed: 03/14/2013 [facility] Patient Information 2015 [place], [place]. This information is not intended to replace advice given to you by your health care provider. Make sure you discuss any questions you have with your health care provider.

## 2014-10-25 NOTE — Progress Notes (Signed)
Subjective:    Patient ID: Rhonda Shannon, female    DOB: [birth year], 39 y.o.   MRN: [id number]  HPI This 39 y.o female is here for CPE/PAP w/ STD screening and other screening labs. She wants to start OCPs.  HCM: MMG- Needs baseline.           PAP- Aug 2013 (negative).           IMM- Current (not sure abut Tdap).           Vision- Every 1-2 years.           Dental- Periodically.  Patient Active Problem List   Diagnosis Date Noted  . Unspecified vitamin D deficiency 05/11/2012  . History of anemia 05/11/2012  . History of anxiety disorder 05/11/2012  . History of tobacco use 05/11/2012    Prior to Admission medications   Medication Sig Start Date End Date Taking? Authorizing Provider  OVER THE COUNTER MEDICATION OTC Biotin, skin, hair, nails taking everyday   Yes Historical Provider, MD  Cholecalciferol (VITAMIN D PO) Take by mouth daily.    Historical Provider, MD  Multiple Vitamin (MULTI-VITAMIN DAILY PO) Take by mouth daily.    Historical Provider, MD  Pyridoxine HCl (VITAMIN B-6 PO) Take by mouth daily.    Historical Provider, MD    History   Social History  . Marital Status: Single    Spouse Name: N/A    Number of Children: N/A  . Years of Education: N/A   Occupational History  . teacher    Social History Main Topics  . Smoking status: Former Smoker    Types: Cigarettes    Quit date: 10/07/2009  . Smokeless tobacco: Not on file  . Alcohol Use: 2.4 oz/week    4 Not specified per week     Comment: beer and wine 2x/wk  . Drug Use: No  . Sexual Activity: Yes   Other Topics Concern  . Not on file   Social History Narrative   Exercise-walking 3x/week for 30 minutes. Single. Education: [employer]. Exercise: Yes.    Family History  Problem Relation Age of Onset  . Hypertension Mother     age dx in her 21's  . Hyperlipidemia Mother   . Stroke Maternal Grandmother   . Dementia Maternal Grandmother   . Aneurysm Maternal Grandfather   . Cancer Father   .  Hypertension Father   . Diabetes Paternal Grandmother     Past Surgical History  Procedure Laterality Date  . Cesarean section  2000, 2003    Review of Systems  Constitutional: Positive for fatigue.  Eyes: Negative.   Respiratory: Negative.   Cardiovascular: Negative.   Gastrointestinal: Negative.   Endocrine: Negative.   Genitourinary: Positive for frequency and pelvic pain.       Has sharp pain localized to adnexal area about 14 days before menses; bleeding lasts 7 days. Urinary frequency attributed to increased water intake. Pt requests OCPs.  Musculoskeletal: Negative.   Skin: Negative.   Allergic/Immunologic: Negative.   Neurological: Positive for headaches.  Hematological: Negative.   Psychiatric/Behavioral: Negative.       Objective:   Physical Exam  Constitutional: She is oriented to person, place, and time. Vital signs are normal. She appears well-developed and well-nourished. No distress.  Blood pressure 120/78, pulse 79, temperature 97.9 F (36.6 C), temperature source Oral, resp. rate 16, height 5' 3.5" (1.613 m), weight 147 lb 3.2 oz (66.769 kg), last menstrual period 10/14/2014, SpO2 100 %.  HENT:  Head: Normocephalic and atraumatic.  Right Ear: Hearing, tympanic membrane, external ear and ear canal normal.  Left Ear: Hearing, tympanic membrane, external ear and ear canal normal.  Nose: Nose normal. No nasal deformity or septal deviation.  Mouth/Throat: Uvula is midline, oropharynx is clear and moist and mucous membranes are normal. No oral lesions. Normal dentition. No dental caries.  Eyes: Conjunctivae, EOM and lids are normal. Pupils are equal, round, and reactive to light. No scleral icterus.  Wears corrective lenses.  Neck: Trachea normal, normal range of motion, full passive range of motion without pain and phonation normal. Neck supple. No spinous process tenderness and no muscular tenderness present. No thyroid mass and no thyromegaly present.    Cardiovascular: Normal rate, regular rhythm, S1 normal, S2 normal, normal heart sounds, intact distal pulses and normal pulses.   No extrasystoles are present. PMI is not displaced.  Exam reveals no gallop and no friction rub.   No murmur heard. Pulmonary/Chest: Effort normal and breath sounds normal. No respiratory distress.  Abdominal: Soft. Normal appearance and bowel sounds are normal. She exhibits no distension and no mass. There is no hepatomegaly. There is no tenderness. There is no guarding and no CVA tenderness.  Genitourinary: Uterus normal. There is no rash, tenderness or lesion on the right labia. There is no rash, tenderness or lesion on the left labia. Uterus is not enlarged and not tender. Cervix exhibits no motion tenderness, no discharge and no friability. Right adnexum displays no mass, no tenderness and no fullness. Left adnexum displays no mass, no tenderness and no fullness. No erythema, tenderness or bleeding in the vagina. No foreign body around the vagina. No signs of injury around the vagina. Vaginal discharge found.  Small polyp on cervix located at 10 o'clock.  Musculoskeletal:       Cervical back: Normal.       Thoracic back: Normal.       Lumbar back: Normal.  Remainder of exam unremarkable.  Lymphadenopathy:       Head (right side): No submental, no submandibular, no tonsillar, no preauricular, no posterior auricular and no occipital adenopathy present.       Head (left side): No submental, no submandibular, no tonsillar, no preauricular, no posterior auricular and no occipital adenopathy present.    She has no cervical adenopathy.    She has no axillary adenopathy.       Right: No inguinal and no supraclavicular adenopathy present.       Left: No inguinal and no supraclavicular adenopathy present.  Neurological: She is alert and oriented to person, place, and time. She has normal strength and normal reflexes. She displays no atrophy. No cranial nerve deficit or  sensory deficit. She exhibits normal muscle tone. Coordination and gait normal.  Skin: Skin is warm, dry and intact. No ecchymosis, no lesion and no rash noted. She is not diaphoretic. No cyanosis or erythema. Nails show no clubbing.  Psychiatric: She has a normal mood and affect. Her speech is normal and behavior is normal. Judgment and thought content normal. Cognition and memory are normal.  Nursing note and vitals reviewed.   Results for orders placed or performed in visit on 10/25/14  POCT urinalysis dipstick  Result Value Ref Range   Color, UA yellow    Clarity, UA clear    Glucose, UA neg    Bilirubin, UA neg    Ketones, UA neg    Spec Grav, UA <=1.005    Blood, UA neg\  pH, UA 5.0    Protein, UA neg    Urobilinogen, UA 0.2    Nitrite, UA neg    Leukocytes, UA Negative        Assessment & Plan:  Annual physical exam  Encounter for cervical Pap smear with pelvic exam - Plan: Pap IG w/ reflex to HPV when ASC-U  Screening for STD (sexually transmitted disease) - Plan: HIV antibody, RPR, POCT urinalysis dipstick  Urinary frequency - Reassurance >> UA negative. Plan: COMPLETE METABOLIC PANEL WITH GFR, POCT urinalysis dipstick  History of anemia - Plan: CBC with Differential  Flu vaccine need - Plan: Flu Vaccine QUAD 36+ mos IM  Screening, lipid - Plan: COMPLETE METABOLIC PANEL WITH GFR, Lipid panel  Screening for breast cancer - Plan: MM Digital Screening

## 2014-10-26 ENCOUNTER — Encounter: Payer: Self-pay | Admitting: Family Medicine

## 2014-10-26 LAB — PAP IG W/ RFLX HPV ASCU

## 2014-10-26 LAB — HIV ANTIBODY (ROUTINE TESTING W REFLEX): HIV 1&2 Ab, 4th Generation: NONREACTIVE

## 2014-10-26 LAB — RPR

## 2014-10-30 NOTE — Progress Notes (Signed)
Quick Note:  Please advise pt regarding following labs... STD screening tests are negative. Metabolic panel- CMET-(salts in the blood, blood sugar, kidney and liver function tests) is normal. Lipid panel is normal. Blood counts confirm a mild anemia but, otherwise, is normal.  PAP is negative (normal).   Copy to pt. ______

## 2014-11-08 ENCOUNTER — Encounter: Payer: Self-pay | Admitting: Radiology

## 2014-11-08 ENCOUNTER — Telehealth: Payer: Self-pay

## 2014-11-08 NOTE — Telephone Encounter (Signed)
Pt needs a CB# on her lab results. I have a new # to reach her. Please advise [phone]

## 2014-11-09 NOTE — Telephone Encounter (Signed)
Notes Recorded by Rhonda MarchBarbara B McPherson, MD on 10/30/2014 at 4:56 PM Please advise pt regarding following labs... STD screening tests are negative. Metabolic panel- CMET-(salts in the blood, blood sugar, kidney and liver function tests) is normal. Lipid panel is normal. Blood counts confirm a mild anemia but, otherwise, is normal.  PAP is negative (normal).   Copy to pt.  Left message on machine to call me back.

## 2014-11-10 NOTE — Telephone Encounter (Signed)
LMOM of results

## 2014-11-10 NOTE — Telephone Encounter (Signed)
Pt called back. Notified of labs. Copy sent

## 2015-02-21 ENCOUNTER — Ambulatory Visit: Payer: BC Managed Care – PPO | Admitting: Family Medicine

## 2016-03-19 ENCOUNTER — Ambulatory Visit (INDEPENDENT_AMBULATORY_CARE_PROVIDER_SITE_OTHER): Payer: BC Managed Care – PPO | Admitting: Pulmonary Disease

## 2016-03-19 ENCOUNTER — Encounter: Payer: Self-pay | Admitting: Pulmonary Disease

## 2016-03-19 VITALS — BP 108/62 | HR 95 | Ht 64.0 in | Wt 143.4 lb

## 2016-03-19 DIAGNOSIS — Z8659 Personal history of other mental and behavioral disorders: Secondary | ICD-10-CM | POA: Diagnosis not present

## 2016-03-19 DIAGNOSIS — G478 Other sleep disorders: Secondary | ICD-10-CM | POA: Diagnosis not present

## 2016-03-19 DIAGNOSIS — G4710 Hypersomnia, unspecified: Secondary | ICD-10-CM | POA: Diagnosis not present

## 2016-03-19 NOTE — Assessment & Plan Note (Addendum)
Pt with worsening hypersomnia. As far as she rememebers, she has always been a sleepy person.   She sleeps 6-8 hrs weekdays and 10 hrs weekends. Same schedule for several yrs.  Wakes up unrefreshed. Occasional headaches in am.  Has snoring. (-) witnessed apneas. Occasional gasping and choking.  Frequent awakenings at night. Tosses and turns.   Has sleep paralysis > 3 times this year.  Has hallucinations (not sure if hypnogogic or hypnopompic) > 3 times this year.  Denies cataplexy.  (-) Abnormal behavior in sleep.   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  Gets home at 5-6 pm.   Has depression and anxiety. Both are stable.  ESS 15.  Plan :   We discussed about the diagnosis of Obstructive Sleep Apnea (OSA) and implications of untreated OSA. We discussed about CPAP and BiPaP as possible treatment options.   We will schedule the patient for a sleep study. Split night study. Hopefully, can get doen in 1-2 weeks. [place] [facility] and is symptomatic.    Patient was instructed to call the office if he/she has not heard back from the office 1-2 weeks after the sleep study.   Patient was instructed to call the office if he/she is having issues with the PAP device.   We discussed good sleep hygiene.   Patient was advised not to engage in activities requiring concentration and/or vigilance if he/she is sleepy.  Patient was advised not to drive if he/she is sleepy.

## 2016-03-19 NOTE — Assessment & Plan Note (Signed)
Has anxiety and depression. Doubt its interfering with sleep.  Not on meds.  Will observe.

## 2016-03-19 NOTE — Assessment & Plan Note (Signed)
Chronic. 3x/this year.  Likely 2/2 untreated OSA. Has hypersomnia. If not better and hypersomnia is not better, may need further work up.  (-) cataplexy.  Not on sedating meds or AD.

## 2016-03-19 NOTE — Progress Notes (Addendum)
Subjective:    Patient ID: Rhonda Shannon, female    DOB: November 14, 1975, 40 y.o.   MRN: [id number]  HPI   This is the case of Rhonda Shannon, 40 y.o. Female, who was referred by Dr. Dow Adolph  in consultation regarding possible OSA.   As you very well know, patient 15 PY smoking history, quit 5 yrs ago. Not known to have asthma or copd. Denies sinus issues. Had PNA 6 yrs ago.   Pt with worsening hypersomnia. As far as she rememebers, she has always been a sleepy person.  She sleeps 6-8 hrs weekdays and 10 hrs weekends. Same schedule for several yrs.  Wakes up unrefreshed. Occasional headaches in am.  Has snoring. (-) witnessed apneas. Occasional gasping and choking.  Frequent awakenings at night. Tosses and turns.   Has sleep paralysis > 3 times this year.  Has hallucinations (not sure if hypnogogic or hypnopompic) > 3 times this year.  Denies cataplexy.  (-) Abnormal behavior in sleep.   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  Gets home at 5-6 pm.   Has depression and anxiety. Both are stable.    Review of Systems  Constitutional: Negative.   HENT: Negative.   Eyes: Negative.   Respiratory: Negative.   Cardiovascular: Negative.   Gastrointestinal: Negative.   Endocrine: Negative.   Genitourinary: Negative.   Musculoskeletal: Negative.   Skin: Negative.   Allergic/Immunologic: Negative.   Neurological: Negative.   Hematological: Negative.   Psychiatric/Behavioral: Negative.   All other systems reviewed and are negative.  Past Medical History  Diagnosis Date  . Depression   . Anemia   . Anxiety    (-) CA, DVT  Family History  Problem Relation Age of Onset  . Hypertension Mother     age dx in her 15's  . Hyperlipidemia Mother   . Stroke Maternal Grandmother   . Dementia Maternal Grandmother   . Aneurysm Maternal Grandfather   . Cancer Father   . Hypertension Father   . Diabetes Paternal Grandmother      Past Surgical  History  Procedure Laterality Date  . Cesarean section  2000, 2003    Social History   Social History  . Marital Status: Single    Spouse Name: N/A  . Number of Children: N/A  . Years of Education: N/A   Occupational History  . teacher    Social History Main Topics  . Smoking status: Former Smoker -- 1.00 packs/day for 16 years    Types: Cigarettes    Quit date: 10/07/2010  . Smokeless tobacco: Never Used  . Alcohol Use: 2.4 oz/week    4 Standard drinks or equivalent per week     Comment: beer and wine 2x/wk  . Drug Use: No  . Sexual Activity: Yes   Other Topics Concern  . Not on file   Social History Narrative   Exercise-walking 3x/week for 30 minutes. Single. Education: [employer]. Exercise: Yes.   Lives with children 2   Teacher     No Known Allergies   Outpatient Prescriptions Prior to Visit  Medication Sig Dispense Refill  . OVER THE COUNTER MEDICATION OTC Biotin, skin, hair, nails taking everyday    . Cholecalciferol (VITAMIN D PO) Take by mouth daily. Reported on 03/19/2016    . Multiple Vitamin (MULTI-VITAMIN DAILY PO) Take by mouth daily. Reported on 03/19/2016    . Norgestimate-Ethinyl Estradiol Triphasic 0.18/0.215/0.25 MG-35 MCG tablet Take 1 tablet by mouth  daily. (Patient not taking: Reported on 03/19/2016) 1 Package 11  . Pyridoxine HCl (VITAMIN B-6 PO) Take by mouth daily. Reported on 03/19/2016     No facility-administered medications prior to visit.   No orders of the defined types were placed in this encounter.          Objective:   Physical Exam   Vitals:  Filed Vitals:   03/19/16 1358  BP: 108/62  Pulse: 95  Height: 5\' 4"  (1.626 m)  Weight: 143 lb 6.4 oz (65.046 kg)  SpO2: 99%    Constitutional/General:  Pleasant, well-nourished, well-developed, not in any distress,  Comfortably seating.  Well kempt  Body mass index is 24.6 kg/(m^2). Wt Readings from Last 3 Encounters:  03/19/16 143 lb 6.4 oz (65.046 kg)  10/25/14 147 lb 3.2 oz  (66.769 kg)  07/10/12 140 lb (63.504 kg)     HEENT: Pupils equal and reactive to light and accommodation. Anicteric sclerae. Normal nasal mucosa.   No oral  lesions,  mouth clear,  oropharynx clear, no postnasal drip. (-) Oral thrush. No dental caries.  Airway - Mallampati class III  Neck: No masses. Midline trachea. No JVD, (-) LAD. (-) bruits appreciated.  Respiratory/Chest: Grossly normal chest. (-) deformity. (-) Accessory muscle use.  Symmetric expansion. (-) Tenderness on palpation.  Resonant on percussion.  Diminished BS on both lower lung zones. (-) wheezing, crackles, rhonchi (-) egophony  Cardiovascular: Regular rate and  rhythm, heart sounds normal, no murmur or gallops, no peripheral edema  Gastrointestinal:  Normal bowel sounds. Soft, non-tender. No hepatosplenomegaly.  (-) masses.   Musculoskeletal:  Normal muscle tone. Normal gait.   Extremities: Grossly normal. (-) clubbing, cyanosis.  (-) edema  Skin: (-) rash,lesions seen.   Neurological/Psychiatric : alert, oriented to time, place, person. Normal mood and affect           Assessment & Plan:  Hypersomnia Pt with worsening hypersomnia. As far as she rememebers, she has always been a sleepy person.   She sleeps 6-8 hrs weekdays and 10 hrs weekends. Same schedule for several yrs.  Wakes up unrefreshed. Occasional headaches in am.  Has snoring. (-) witnessed apneas. Occasional gasping and choking.  Frequent awakenings at night. Tosses and turns.   Has sleep paralysis > 3 times this year.  Has hallucinations (not sure if hypnogogic or hypnopompic) > 3 times this year.  Denies cataplexy.  (-) Abnormal behavior in sleep.   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  Gets home at 5-6 pm.   Has depression and anxiety. Both are stable.  ESS 15.  Plan :   We discussed about the diagnosis of Obstructive Sleep Apnea (OSA) and implications of untreated OSA. We discussed about CPAP  and BiPaP as possible treatment options.   We will schedule the patient for a sleep study. Split night study. Hopefully, can get doen in 1-2 weeks. [place] and is symptomatic.    Patient was instructed to call the office if he/she has not heard back from the office 1-2 weeks after the sleep study.   Patient was instructed to call the office if he/she is having issues with the PAP device.   We discussed good sleep hygiene.   Patient was advised not to engage in activities requiring concentration and/or vigilance if he/she is sleepy.  Patient was advised not to drive if he/she is sleepy.     History of anxiety disorder Has anxiety and depression. Doubt  its interfering with sleep.  Not on meds.  Will observe.   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  Likely 2/2 untreated OSA. Has hypersomnia. If not better and hypersomnia is not better, may need further work up.  (-) cataplexy.  Not on sedating meds or AD.     Thank you very much for letting me participate in this patient's care. Please do not hesitate to give me a call if you have any questions or concerns regarding the treatment plan.   Patient will follow up with me in 2-3 mos. Sooner if she remains symptomatic.     Pollie Meyer, MD 03/19/2016   6:46 PM Pulmonary and Critical Care Medicine [facility] Pager: [phone] Office: [phone], Fax: [phone]

## 2016-03-19 NOTE — Patient Instructions (Signed)
It was a pleasure taking care of you today!  We will schedule you to have a sleep study to determine if you have sleep apnea.    We will get a lab sleep study.  You will be scheduled to have a lab sleep study in 4-6 weeks.  Someone from the sleep lab will call you in 2-3 days to schedule the study with you.  They usually have cancellations every night so most likely, they will have openings for a lab sleep study next week or so.  We encourage you to do your sleep study then if possible. Please give us a call in a week is no one from the sleep lab calls you in 2-3 days.   If the sleep study is positive, we will order you a CPAP  machine.  Please call the office if you do NOT receive your machine in the next 1-2 weeks.   Please make sure you use your CPAP device everytime you sleep.  We will monitor the usage of your machine per your insurance requirement.  Your insurance company may take the machine from you if you are not using it regularly.   Please clean the mask, tubings, filter, water reservoir with soapy water every week.  Please use distilled water for the water reservoir.   Please call the office or your machine provider (DME company) if you are having issues with the device.   Return to clinic in 2-3 months with Dr. De Dios or NP      

## 2016-05-05 ENCOUNTER — Ambulatory Visit (HOSPITAL_BASED_OUTPATIENT_CLINIC_OR_DEPARTMENT_OTHER): Payer: BC Managed Care – PPO | Attending: Pulmonary Disease | Admitting: Pulmonary Disease

## 2016-05-05 VITALS — Ht 64.0 in | Wt 145.0 lb

## 2016-05-05 DIAGNOSIS — G4710 Hypersomnia, unspecified: Secondary | ICD-10-CM | POA: Diagnosis present

## 2016-05-05 DIAGNOSIS — G4730 Sleep apnea, unspecified: Secondary | ICD-10-CM | POA: Insufficient documentation

## 2016-05-05 DIAGNOSIS — R0683 Snoring: Secondary | ICD-10-CM | POA: Diagnosis not present

## 2016-05-05 DIAGNOSIS — G4719 Other hypersomnia: Secondary | ICD-10-CM | POA: Insufficient documentation

## 2016-05-12 DIAGNOSIS — G4710 Hypersomnia, unspecified: Secondary | ICD-10-CM | POA: Diagnosis not present

## 2016-05-13 ENCOUNTER — Telehealth: Payer: Self-pay | Admitting: Pulmonary Disease

## 2016-05-13 NOTE — Telephone Encounter (Signed)
Patient notified of Dr. Clayborn BignesseDios recommendations.  Patient says that she has an appointment already scheduled with Dr. Clayborn BignesseDios on 8/31 and she will just keep her appointment that day and discuss options with him at that time.  FYI to Dr. Clayborn BignesseDios

## 2016-05-13 NOTE — Telephone Encounter (Signed)
   Sheena :  pls call pt and tell her the inlab sleep study did NOT show significant OSA.  Options: 1. If her insurance will approve for Sept 2017, she can have a home sleep test to cover more nights. She can follow up after HST.   OR   2. Since she is very symptomatic, I can see her as a follow up next week so we can sort things out.   pls let me know what she says. thanks J. Alexis FrockAngelo A de Dios, MD 05/13/2016, 5:13 AM [facility] Pager [phone] After 3 pm or if no answer, call [phone]

## 2016-05-13 NOTE — Telephone Encounter (Signed)
LM x 1 

## 2016-05-13 NOTE — Procedures (Signed)
NAME: [name] DATE OF BIRTH:  03-Apr-1976 MEDICAL RECORD NUMBER [id number]  LOCATION: [facility]  PHYSICIAN: Daneen Schick Dios  DATE OF STUDY: 05/05/2016   Patient Name: Rhonda Shannon, Rhonda Shannon Date: 05/05/2016   Gender: Female  D.O.B: [birth year]  Age (years): 40  Referring Provider: Delphina Cahill De Dios   Height (inches): 64  Interpreting Physician: Delphina Cahill De Dios   Weight (lbs): 145  RPSGT: Wylie Hail   BMI: 25  MRN: [id number]  Neck Size: 14.00    CLINICAL INFORMATION  Sleep Study Type: NPSG  Indication for sleep study: Excessive Daytime Sleepiness  Epworth Sleepiness Score: 10   SLEEP STUDY TECHNIQUE  As per the AASM Manual for the Scoring of Sleep and Associated Events v2.3 (April 2016) with a hypopnea requiring 4% desaturations.  The channels recorded and monitored were frontal, central and occipital EEG, electrooculogram (EOG), submentalis EMG (chin), nasal and oral airflow, thoracic and abdominal wall motion, anterior tibialis EMG, snore microphone, electrocardiogram, and pulse oximetry.   MEDICATIONS  Patient's medications include: N/A. Meds reviewed via chart review done. Medications self-administered by patient during sleep study : No sleep medicine administered.  SLEEP ARCHITECTURE  The study was initiated at 10:28:59 PM and ended at 4:45:54 AM.  Sleep onset time was 62.5 minutes and the sleep efficiency was 71.7%. The total sleep time was 270.4 minutes.  Stage REM latency was 60.0 minutes.  The patient spent 17.54% of the night in stage N1 sleep, 65.26% in stage N2 sleep, 1.11% in stage N3 and 16.08% in REM.  Alpha intrusion was absent.  Supine sleep was 61.45%.   RESPIRATORY PARAMETERS  The overall apnea/hypopnea index (AHI) was 0.4 per hour. There were 2 total apneas, including 2 obstructive, 0 central and 0 mixed apneas. There were 0 hypopneas and 7 RERAs.  The AHI during Stage REM sleep was 2.8 per hour. AHI  while supine was 0.4 per hour.  The mean oxygen saturation was 96.63%. The minimum SpO2 during sleep was 94.00%.  Moderate snoring was noted during this study.  CARDIAC DATA  The 2 lead EKG demonstrated sinus rhythm. The mean heart rate was 71.74 beats per minute. Other EKG findings include: None.   LEG MOVEMENT DATA  The total PLMS were 0 with a resulting PLMS index of 0.00. Associated arousal with leg movement index was 0.0 .  IMPRESSIONS  No significant obstructive sleep apnea occurred during this study (AHI = 0.4/h). No significant central sleep apnea occurred during this study (CAI = 0.0/h). The patient had minimal or no oxygen desaturation during the study (Min O2 = 94.00%) The patient snored with Moderate snoring volume. No cardiac abnormalities were noted during this study. Clinically significant periodic limb movements did not occur during sleep. No significant associated arousals.  DIAGNOSIS  No evidence for significant OSA based on this study.   RECOMMENDATIONS  If patient's hypersomnia persists, suggest getting a home sleep test to cover more hours of sleep. Suggest working up patient for other causes of hypersomnia/fatigue such as medicine or drug-induced, or depression/anxiety, or idiopathic hypersomnia. Avoid alcohol, sedatives and other CNS depressants that may worsen sleep apnea and disrupt normal sleep architecture. Sleep hygiene should be reviewed to assess factors that may improve sleep quality. Weight management and regular exercise should be initiated or continued if appropriate. Follow up in the office as scheduled.  Pollie Meyer, MD 05/13/2016, 5:09 AM [facility] Pager [phone] After 3 pm or if no answer, call [phone]

## 2016-06-06 ENCOUNTER — Ambulatory Visit: Payer: BC Managed Care – PPO | Admitting: Pulmonary Disease

## 2016-09-12 ENCOUNTER — Ambulatory Visit (INDEPENDENT_AMBULATORY_CARE_PROVIDER_SITE_OTHER): Payer: BC Managed Care – PPO | Admitting: Physician Assistant

## 2016-09-12 VITALS — BP 129/83 | HR 90 | Temp 98.4°F | Resp 17 | Ht 64.0 in | Wt 147.0 lb

## 2016-09-12 DIAGNOSIS — M2550 Pain in unspecified joint: Secondary | ICD-10-CM

## 2016-09-12 DIAGNOSIS — L659 Nonscarring hair loss, unspecified: Secondary | ICD-10-CM | POA: Diagnosis not present

## 2016-09-12 NOTE — Patient Instructions (Addendum)
I am going to discuss your lab results and we will go from there.   In the meantime, make sure you are eating clean vegetables, hydrating well, and getting a good source of protein.    IF you received an x-ray today, you will receive an invoice from [facility]. Please contact [facility] at [phone] with questions or concerns regarding your invoice.   IF you received labwork today, you will receive an invoice from [facility]. Please contact [facility] at [phone] with questions or concerns regarding your invoice.   Our billing staff will not be able to assist you with questions regarding bills from these companies.  You will be contacted with the lab results as soon as they are available. The fastest way to get your results is to activate your My Chart account. Instructions are located on the last page of this paperwork. If you have not heard from us regarding the results in 2 weeks, please contact this office.

## 2016-09-12 NOTE — Progress Notes (Signed)
[facility] [address], [place] [address] [phone]  Date:  09/12/2016   Name:  Rhonda Shannon   DOB:  11-19-1975   MRN:  [id number]  PCP:  Lupita RaiderSHAW,KIMBERLEE, MD    History of Present Illness:  Rhonda Shannon is a 40 y.o. female patient who presents to [facility] for cc of generalized body aches.    5 years of noticing bodyaches mainly in the legs, feeling sore.  This resolves, but intermittently she has knee pain, but has ankle pain for the last 2 weeks and wrist pain.   She is a Runner, broadcasting/film/videoteacher, with random sharp pain.   She has a rash that will flare up around her elbows with pain, then rash under armpit which burns.  She notices no redness.  She noticed her left ankle was swollen once day.  She denies any fever.  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  She denies any trauma.  She has no change in her bowels.  Normal menses.  Patient's last menstrual period was 09/06/2016.  No family hx of thyroid disease.  No hx of auto-immune illnesses.   She has noticed some hair changes.  No flaking or pruritus.     Patient Active Problem List   Diagnosis Date Noted  . 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 Unspecified vitamin D deficiency 05/11/2012  . History of anemia 05/11/2012  . History of anxiety disorder 05/11/2012  . History of tobacco use 05/11/2012    Past Medical History:  Diagnosis Date  . Anemia   . Anxiety   . Depression     Past Surgical History:  Procedure Laterality Date  . CESAREAN SECTION  2000, 2003    Social History  Substance Use Topics  . Smoking status: Former Smoker    Packs/day: 1.00    Years: 16.00    Types: Cigarettes    Quit date: 10/07/2010  . Smokeless tobacco: Never Used  . Alcohol use 2.4 oz/week    4 Standard drinks or equivalent per week     Comment: beer and wine 2x/wk    Family History  Problem Relation Age of Onset  . Hypertension Mother     age dx in her 8150's  .  Hyperlipidemia Mother   . Stroke Maternal Grandmother   . Dementia Maternal Grandmother   . Aneurysm Maternal Grandfather   . Cancer Father   . Hypertension Father   . Diabetes Paternal Grandmother     No Known Allergies  Medication list has been reviewed and updated.  Current Outpatient Prescriptions on File Prior to Visit  Medication Sig Dispense Refill  . OVER THE COUNTER MEDICATION OTC Biotin, skin, hair, nails taking everyday     No current facility-administered medications on file prior to visit.     ROS ROS otherwise unremarkable unless listed above.  Physical Examination: BP 129/83 (BP Location: Right Arm, Patient Position: Sitting, Cuff Size: Normal)   Pulse 90   Temp 98.4 F (36.9 C) (Oral)   Resp 17   Ht 5\' 4"  (1.626 m)   Wt 147 lb (66.7 kg)   LMP 08/28/2016 (Approximate)   SpO2 99%   BMI 25.23 kg/m  Ideal Body Weight: Weight in (lb) to have BMI = 25: 145.3  Physical Exam  Constitutional: She is oriented to person, place, and time. She appears well-developed and well-nourished. No distress.  HENT:  Head: Normocephalic and atraumatic.  Right Ear:  External ear normal.  Left Ear: External ear normal.  Eyes: Conjunctivae and EOM are normal. Pupils are equal, round, and reactive to light.  Cardiovascular: Normal rate.   Pulmonary/Chest: Effort normal. No respiratory distress.  Neurological: She is alert and oriented to person, place, and time.  Skin: She is not diaphoretic.  Psychiatric: She has a normal mood and affect. Her behavior is normal.     Assessment and Plan: Rhonda Shannon is a 40 y.o. female who is here today generalized body aches. Diff: auto-immune arthropathy, thyroid, vitamin D, fibromyalgia. Will follow up with lab results.  Arthralgia, unspecified joint - Plan: Basic metabolic panel, CBC, TSH, Sedimentation Rate  Hair loss - Plan: Basic metabolic panel, CBC, TSH, Sedimentation Rate  Trena PlattStephanie Daxten Kovalenko, PA-C [facility] 09/12/2016 11:23 AM

## 2016-09-13 LAB — BASIC METABOLIC PANEL
BUN/Creatinine Ratio: 13 (ref 9–23)
BUN: 8 mg/dL (ref 6–20)
CALCIUM: 9.5 mg/dL (ref 8.7–10.2)
CHLORIDE: 101 mmol/L (ref 96–106)
CO2: 27 mmol/L (ref 18–29)
Creatinine, Ser: 0.62 mg/dL (ref 0.57–1.00)
GFR calc Af Amer: 131 mL/min/{1.73_m2} (ref >59)
GFR calc non Af Amer: 114 mL/min/{1.73_m2} (ref >59)
GLUCOSE: 91 mg/dL (ref 65–99)
POTASSIUM: 4.5 mmol/L (ref 3.5–5.2)
Sodium: 139 mmol/L (ref 134–144)

## 2016-09-13 LAB — CBC
HEMOGLOBIN: 11 g/dL — AB (ref 11.1–15.9)
Hematocrit: 35.2 % (ref 34.0–46.6)
MCH: 27.9 pg (ref 26.6–33.0)
MCHC: 31.3 g/dL — ABNORMAL LOW (ref 31.5–35.7)
MCV: 89 fL (ref 79–97)
PLATELETS: 353 10*3/uL (ref 150–379)
RBC: 3.94 x10E6/uL (ref 3.77–5.28)
RDW: 13.4 % (ref 12.3–15.4)
WBC: 4.3 10*3/uL (ref 3.4–10.8)

## 2016-09-13 LAB — SEDIMENTATION RATE: SED RATE: 5 mm/h (ref 0–32)

## 2016-09-13 LAB — TSH: TSH: 0.69 u[IU]/mL (ref 0.450–4.500)

## 2016-09-24 ENCOUNTER — Other Ambulatory Visit: Payer: Self-pay | Admitting: Physician Assistant

## 2016-09-24 DIAGNOSIS — D649 Anemia, unspecified: Secondary | ICD-10-CM

## 2016-09-24 DIAGNOSIS — M2550 Pain in unspecified joint: Secondary | ICD-10-CM

## 2016-09-26 ENCOUNTER — Ambulatory Visit (INDEPENDENT_AMBULATORY_CARE_PROVIDER_SITE_OTHER): Payer: BC Managed Care – PPO | Admitting: Physician Assistant

## 2016-09-26 ENCOUNTER — Encounter: Payer: Self-pay | Admitting: Physician Assistant

## 2016-09-26 VITALS — BP 112/76 | HR 84 | Temp 98.9°F | Resp 18 | Ht 64.0 in | Wt 149.0 lb

## 2016-09-26 DIAGNOSIS — M2550 Pain in unspecified joint: Secondary | ICD-10-CM

## 2016-09-26 DIAGNOSIS — D649 Anemia, unspecified: Secondary | ICD-10-CM

## 2016-09-26 DIAGNOSIS — J069 Acute upper respiratory infection, unspecified: Secondary | ICD-10-CM | POA: Diagnosis not present

## 2016-09-26 MED ORDER — BENZONATATE 100 MG PO CAPS: 100.0000 mg | ORAL_CAPSULE | Freq: Three times a day (TID) | ORAL | 0 refills | Status: DC | PRN

## 2016-09-26 MED ORDER — GUAIFENESIN ER 1200 MG PO TB12: 1.0000 | ORAL_TABLET | Freq: Two times a day (BID) | ORAL | 1 refills | Status: DC | PRN

## 2016-09-26 NOTE — Patient Instructions (Signed)
     IF you received an x-ray today, you will receive an invoice from [facility]. Please contact [facility] at [phone] with questions or concerns regarding your invoice.   IF you received labwork today, you will receive an invoice from [facility]. Please contact [facility] at [phone] with questions or concerns regarding your invoice.   Our billing staff will not be able to assist you with questions regarding bills from these companies.  You will be contacted with the lab results as soon as they are available. The fastest way to get your results is to activate your My Chart account. Instructions are located on the last page of this paperwork. If you have not heard from us regarding the results in 2 weeks, please contact this office.     

## 2016-09-27 LAB — VITAMIN D 25 HYDROXY (VIT D DEFICIENCY, FRACTURES): Vit D, 25-Hydroxy: 18.9 ng/mL — ABNORMAL LOW (ref 30.0–100.0)

## 2016-09-30 LAB — IRON AND TIBC
Iron Saturation: 25 % (ref 15–55)
Iron: 95 ug/dL (ref 27–159)
TIBC: 384 ug/dL (ref 250–450)
UIBC: 289 ug/dL (ref 131–425)

## 2016-09-30 LAB — VITAMIN D 1,25 DIHYDROXY
VITAMIN D 1, 25 (OH) TOTAL: 65 pg/mL
Vitamin D2 1, 25 (OH)2: <10 pg/mL
Vitamin D3 1, 25 (OH)2: 65 pg/mL

## 2016-10-06 LAB — PATHOLOGIST SMEAR REVIEW
BASOS ABS: 0 10*3/uL (ref 0.0–0.2)
Basos: 0 %
EOS (ABSOLUTE): 0.2 10*3/uL (ref 0.0–0.4)
Eos: 3 %
HEMATOCRIT: 35.4 % (ref 34.0–46.6)
HEMOGLOBIN: 11.1 g/dL (ref 11.1–15.9)
Immature Grans (Abs): 0 10*3/uL (ref 0.0–0.1)
Immature Granulocytes: 0 %
Lymphocytes Absolute: 1.9 10*3/uL (ref 0.7–3.1)
Lymphs: 33 %
MCH: 28.1 pg (ref 26.6–33.0)
MCHC: 31.4 g/dL — AB (ref 31.5–35.7)
MCV: 90 fL (ref 79–97)
MONOCYTES: 7 %
MONOS ABS: 0.4 10*3/uL (ref 0.1–0.9)
Neutrophils Absolute: 3.2 10*3/uL (ref 1.4–7.0)
Neutrophils: 57 %
PATH REV RBC: NORMAL
PATH REV WBC: NORMAL
PLATELETS: 343 10*3/uL (ref 150–379)
Path Rev PLTs: NORMAL
RBC: 3.95 x10E6/uL (ref 3.77–5.28)
RDW: 13.6 % (ref 12.3–15.4)
WBC: 5.6 10*3/uL (ref 3.4–10.8)

## 2016-10-06 NOTE — Progress Notes (Signed)
[facility] [address], [place] [id number] [phone]  Date:  09/26/2016   Name:  Rhonda KinsDanielle M Carter-Coudriet   DOB:  10-02-1976   MRN:  [id number]  PCP:  Lupita RaiderSHAW,KIMBERLEE, MD    History of Present Illness:  Rhonda Shannon is a 40 y.o. female patient who [facility] follow up blood work.  She reports that the joint pain has resolved at this time.  She was seen here 14 days ago, for joint pains.  Workup provided, and advised to return for blood work. Also complains of nasal congestion and cough for several days.  Non-productive cough.  Mild rhinorrhea.  No fever.  No sob or dyspnea.  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    Patient Active Problem List   Diagnosis Date Noted  . 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 Unspecified vitamin D deficiency 05/11/2012  . History of anemia 05/11/2012  . History of anxiety disorder 05/11/2012  . History of tobacco use 05/11/2012    Past Medical History:  Diagnosis Date  . Anemia   . Anxiety   . Depression     Past Surgical History:  Procedure Laterality Date  . CESAREAN SECTION  2000, 2003    Social History  Substance Use Topics  . Smoking status: Former Smoker    Packs/day: 1.00    Years: 16.00    Types: Cigarettes    Quit date: 10/07/2010  . Smokeless tobacco: Never Used  . Alcohol use 2.4 oz/week    4 Standard drinks or equivalent per week     Comment: beer and wine 2x/wk    Family History  Problem Relation Age of Onset  . Hypertension Mother     age dx in her 4150's  . Hyperlipidemia Mother   . Stroke Maternal Grandmother   . Dementia Maternal Grandmother   . Aneurysm Maternal Grandfather   . Cancer Father   . Hypertension Father   . Diabetes Paternal Grandmother     No Known Allergies  Medication list has been reviewed and updated.  Current Outpatient Prescriptions on File Prior to Visit  Medication Sig  Dispense Refill  . OVER THE COUNTER MEDICATION OTC Biotin, skin, hair, nails taking everyday     No current facility-administered medications on file prior to visit.     ROS ROS otherwise unremarkable unless listed above.   Physical Examination: BP 112/76 (BP Location: Right Arm, Patient Position: Sitting, Cuff Size: Small)   Pulse 84   Temp 98.9 F (37.2 C) (Oral)   Resp 18   Ht 5\' 4"  (1.626 m)   Wt 149 lb (67.6 kg)   LMP 09/05/2016   SpO2 98%   BMI 25.58 kg/m  Ideal Body Weight: Weight in (lb) to have BMI = 25: 145.3  Physical Exam  Constitutional: She is oriented to person, place, and time. She appears well-developed and well-nourished. No distress.  HENT:  Head: Normocephalic and atraumatic.  Right Ear: Tympanic membrane, external ear and ear canal normal.  Left Ear: Tympanic membrane, external ear and ear canal normal.  Nose: Mucosal edema and rhinorrhea present. Right sinus exhibits no maxillary sinus tenderness and no frontal sinus tenderness. Left sinus exhibits no maxillary sinus tenderness and no frontal sinus tenderness.  Mouth/Throat: No uvula swelling. No oropharyngeal exudate, posterior oropharyngeal edema or posterior oropharyngeal erythema.  Eyes: Conjunctivae and EOM are normal. Pupils are equal, round, and reactive  to light.  Cardiovascular: Normal rate and regular rhythm.  Exam reveals no gallop, no distant heart sounds and no friction rub.   No murmur heard. Pulmonary/Chest: Effort normal. No respiratory distress. She has no decreased breath sounds. She has no wheezes. She has no rhonchi.  Lymphadenopathy:       Head (right side): No submandibular, no tonsillar, no preauricular and no posterior auricular adenopathy present.       Head (left side): No submandibular, no tonsillar, no preauricular and no posterior auricular adenopathy present.  Neurological: She is alert and oriented to person, place, and time.  Skin: She is not diaphoretic.  Psychiatric: She  has a normal mood and affect. Her behavior is normal.     Assessment and Plan: Rhonda KinsDanielle M Carter-Mcgowen is a 40 y.o. female who is here today for cc of cough, nasal congestion, and follow up blood work. Viral uri--treat supportively. Labs listed below--will follow  Up as needed.   Acute upper respiratory infection - Plan: benzonatate (TESSALON) 100 MG capsule, Guaifenesin (MUCINEX MAXIMUM STRENGTH) 1200 MG TB12  Low hemoglobin - Plan: Iron and TIBC, Pathologist smear review, Vitamin D 1,25 dihydroxy, VITAMIN D 25 Hydroxy (Vit-D Deficiency, Fractures)  Arthralgia, unspecified joint - Plan: Iron and TIBC, Pathologist smear review, Vitamin D 1,25 dihydroxy, VITAMIN D 25 Hydroxy (Vit-D Deficiency, Fractures)  Trena PlattStephanie English, PA-C [facility] Trena PlattStephanie English, PA-C [facility] 10/06/2016 10:02 AM

## 2017-07-07 ENCOUNTER — Inpatient Hospital Stay (HOSPITAL_COMMUNITY): Payer: BC Managed Care – PPO

## 2017-07-07 ENCOUNTER — Encounter (HOSPITAL_COMMUNITY): Payer: Self-pay

## 2017-07-07 ENCOUNTER — Inpatient Hospital Stay (HOSPITAL_COMMUNITY): Payer: BC Managed Care – PPO | Admitting: Anesthesiology

## 2017-07-07 ENCOUNTER — Ambulatory Visit (HOSPITAL_COMMUNITY)
Admission: AD | Admit: 2017-07-07 | Discharge: 2017-07-07 | Disposition: A | Discharge status: Home / Self Care | Payer: BC Managed Care – PPO | Source: Ambulatory Visit | Attending: Obstetrics and Gynecology | Admitting: Obstetrics and Gynecology

## 2017-07-07 ENCOUNTER — Encounter (HOSPITAL_COMMUNITY)
Admission: AD | Disposition: A | Discharge status: Home / Self Care | Payer: Self-pay | Source: Ambulatory Visit | Attending: Obstetrics and Gynecology

## 2017-07-07 DIAGNOSIS — Z7982 Long term (current) use of aspirin: Secondary | ICD-10-CM | POA: Insufficient documentation

## 2017-07-07 DIAGNOSIS — Z8249 Family history of ischemic heart disease and other diseases of the circulatory system: Secondary | ICD-10-CM | POA: Diagnosis not present

## 2017-07-07 DIAGNOSIS — Z87891 Personal history of nicotine dependence: Secondary | ICD-10-CM | POA: Insufficient documentation

## 2017-07-07 DIAGNOSIS — O034 Incomplete spontaneous abortion without complication: Secondary | ICD-10-CM | POA: Diagnosis present

## 2017-07-07 DIAGNOSIS — Z809 Family history of malignant neoplasm, unspecified: Secondary | ICD-10-CM | POA: Diagnosis not present

## 2017-07-07 DIAGNOSIS — F419 Anxiety disorder, unspecified: Secondary | ICD-10-CM | POA: Insufficient documentation

## 2017-07-07 DIAGNOSIS — O209 Hemorrhage in early pregnancy, unspecified: Secondary | ICD-10-CM

## 2017-07-07 DIAGNOSIS — F329 Major depressive disorder, single episode, unspecified: Secondary | ICD-10-CM | POA: Diagnosis not present

## 2017-07-07 DIAGNOSIS — Z823 Family history of stroke: Secondary | ICD-10-CM | POA: Diagnosis not present

## 2017-07-07 DIAGNOSIS — Z79899 Other long term (current) drug therapy: Secondary | ICD-10-CM | POA: Diagnosis not present

## 2017-07-07 DIAGNOSIS — Z820 Family history of epilepsy and other diseases of the nervous system: Secondary | ICD-10-CM | POA: Diagnosis not present

## 2017-07-07 DIAGNOSIS — Z833 Family history of diabetes mellitus: Secondary | ICD-10-CM | POA: Insufficient documentation

## 2017-07-07 DIAGNOSIS — Z3A09 9 weeks gestation of pregnancy: Secondary | ICD-10-CM

## 2017-07-07 DIAGNOSIS — O208 Other hemorrhage in early pregnancy: Secondary | ICD-10-CM

## 2017-07-07 HISTORY — PX: DILATION AND EVACUATION: SHX1459

## 2017-07-07 LAB — CBC WITH DIFFERENTIAL/PLATELET
BASOS ABS: 0 10*3/uL (ref 0.0–0.1)
BASOS PCT: 0 %
Eosinophils Absolute: 0.1 10*3/uL (ref 0.0–0.7)
Eosinophils Relative: 2 %
HEMATOCRIT: 31.9 % — AB (ref 36.0–46.0)
Hemoglobin: 10.5 g/dL — ABNORMAL LOW (ref 12.0–15.0)
LYMPHS PCT: 33 %
Lymphs Abs: 1.8 10*3/uL (ref 0.7–4.0)
MCH: 28.7 pg (ref 26.0–34.0)
MCHC: 32.9 g/dL (ref 30.0–36.0)
MCV: 87.2 fL (ref 78.0–100.0)
MONOS PCT: 3 %
Monocytes Absolute: 0.2 10*3/uL (ref 0.1–1.0)
NEUTROS ABS: 3.4 10*3/uL (ref 1.7–7.7)
NEUTROS PCT: 62 %
Platelets: 275 10*3/uL (ref 150–400)
RBC: 3.66 MIL/uL — ABNORMAL LOW (ref 3.87–5.11)
RDW: 13.2 % (ref 11.5–15.5)
WBC: 5.4 10*3/uL (ref 4.0–10.5)

## 2017-07-07 LAB — TYPE AND SCREEN
ABO/RH(D): A POS
Antibody Screen: NEGATIVE

## 2017-07-07 LAB — ABO/RH: ABO/RH(D): A POS

## 2017-07-07 SURGERY — DILATION AND EVACUATION, UTERUS
Anesthesia: General | Site: Vagina

## 2017-07-07 MED ORDER — SOD CITRATE-CITRIC ACID 500-334 MG/5ML PO SOLN
ORAL | Status: AC
  Filled 2017-07-07: qty 15

## 2017-07-07 MED ORDER — LACTATED RINGERS IV SOLN: INTRAVENOUS | Status: DC

## 2017-07-07 MED ORDER — GLYCOPYRROLATE 0.2 MG/ML IJ SOLN
INTRAMUSCULAR | Status: DC | PRN
  Administered 2017-07-07: 0.1 mg via INTRAVENOUS

## 2017-07-07 MED ORDER — DEXAMETHASONE SODIUM PHOSPHATE 10 MG/ML IJ SOLN
INTRAMUSCULAR | Status: DC | PRN
  Administered 2017-07-07: 10 mg via INTRAVENOUS

## 2017-07-07 MED ORDER — MIDAZOLAM HCL 5 MG/5ML IJ SOLN
INTRAMUSCULAR | Status: DC | PRN
  Administered 2017-07-07: 2 mg via INTRAVENOUS

## 2017-07-07 MED ORDER — HYDROMORPHONE HCL 1 MG/ML IJ SOLN
1.0000 mg | Freq: Once | INTRAMUSCULAR | Status: AC
  Administered 2017-07-07: 1 mg via INTRAVENOUS
  Filled 2017-07-07: qty 1

## 2017-07-07 MED ORDER — LIDOCAINE HCL 2 % IJ SOLN
INTRAMUSCULAR | Status: DC | PRN
  Administered 2017-07-07: 10 mL

## 2017-07-07 MED ORDER — PROPOFOL 10 MG/ML IV BOLUS
INTRAVENOUS | Status: AC
  Filled 2017-07-07: qty 20

## 2017-07-07 MED ORDER — SCOPOLAMINE 1 MG/3DAYS TD PT72
MEDICATED_PATCH | TRANSDERMAL | Status: DC | PRN
  Administered 2017-07-07: 1 via TRANSDERMAL

## 2017-07-07 MED ORDER — IBUPROFEN 600 MG PO TABS: 600.0000 mg | ORAL_TABLET | Freq: Four times a day (QID) | ORAL | 0 refills | Status: AC | PRN

## 2017-07-07 MED ORDER — FAMOTIDINE IN NACL 20-0.9 MG/50ML-% IV SOLN
20.0000 mg | Freq: Once | INTRAVENOUS | Status: AC
  Administered 2017-07-07: 20 mg via INTRAVENOUS

## 2017-07-07 MED ORDER — LIDOCAINE HCL (CARDIAC) 20 MG/ML IV SOLN
INTRAVENOUS | Status: AC
  Filled 2017-07-07: qty 5

## 2017-07-07 MED ORDER — ONDANSETRON HCL 4 MG/2ML IJ SOLN
INTRAMUSCULAR | Status: DC | PRN
  Administered 2017-07-07: 4 mg via INTRAVENOUS

## 2017-07-07 MED ORDER — SOD CITRATE-CITRIC ACID 500-334 MG/5ML PO SOLN
30.0000 mL | Freq: Once | ORAL | Status: AC
  Administered 2017-07-07: 30 mL via ORAL

## 2017-07-07 MED ORDER — ONDANSETRON HCL 4 MG/2ML IJ SOLN
INTRAMUSCULAR | Status: AC
  Filled 2017-07-07: qty 2

## 2017-07-07 MED ORDER — MIDAZOLAM HCL 2 MG/2ML IJ SOLN
INTRAMUSCULAR | Status: AC
  Filled 2017-07-07: qty 2

## 2017-07-07 MED ORDER — KETOROLAC TROMETHAMINE 30 MG/ML IJ SOLN
INTRAMUSCULAR | Status: AC
  Filled 2017-07-07: qty 1

## 2017-07-07 MED ORDER — LACTATED RINGERS IV SOLN
INTRAVENOUS | Status: DC
  Administered 2017-07-07: 16:00:00 via INTRAVENOUS

## 2017-07-07 MED ORDER — LIDOCAINE HCL (CARDIAC) 20 MG/ML IV SOLN
INTRAVENOUS | Status: DC | PRN
  Administered 2017-07-07: 100 mg via INTRAVENOUS

## 2017-07-07 MED ORDER — FENTANYL CITRATE (PF) 100 MCG/2ML IJ SOLN
INTRAMUSCULAR | Status: AC
  Filled 2017-07-07: qty 2

## 2017-07-07 MED ORDER — FENTANYL CITRATE (PF) 100 MCG/2ML IJ SOLN
INTRAMUSCULAR | Status: DC | PRN
  Administered 2017-07-07: 100 ug via INTRAVENOUS

## 2017-07-07 MED ORDER — DEXAMETHASONE SODIUM PHOSPHATE 10 MG/ML IJ SOLN
INTRAMUSCULAR | Status: AC
  Filled 2017-07-07: qty 1

## 2017-07-07 MED ORDER — SCOPOLAMINE 1 MG/3DAYS TD PT72
MEDICATED_PATCH | TRANSDERMAL | Status: AC
  Filled 2017-07-07: qty 1

## 2017-07-07 MED ORDER — LACTATED RINGERS IV SOLN
INTRAVENOUS | Status: DC
  Administered 2017-07-07: 14:00:00 via INTRAVENOUS

## 2017-07-07 MED ORDER — SUCCINYLCHOLINE CHLORIDE 200 MG/10ML IV SOSY
PREFILLED_SYRINGE | INTRAVENOUS | Status: AC
  Filled 2017-07-07: qty 10

## 2017-07-07 MED ORDER — FENTANYL CITRATE (PF) 100 MCG/2ML IJ SOLN: 25.0000 ug | INTRAMUSCULAR | Status: DC | PRN

## 2017-07-07 MED ORDER — FAMOTIDINE IN NACL 20-0.9 MG/50ML-% IV SOLN
INTRAVENOUS | Status: AC
  Filled 2017-07-07: qty 50

## 2017-07-07 MED ORDER — KETOROLAC TROMETHAMINE 30 MG/ML IJ SOLN
INTRAMUSCULAR | Status: DC | PRN
Start: 2017-07-07 — End: 2017-07-07
  Administered 2017-07-07: 30 mg via INTRAVENOUS

## 2017-07-07 MED ORDER — OXYCODONE-ACETAMINOPHEN 2.5-325 MG PO TABS: 1.0000 | ORAL_TABLET | ORAL | 0 refills | Status: DC | PRN

## 2017-07-07 MED ORDER — LIDOCAINE HCL 2 % IJ SOLN
INTRAMUSCULAR | Status: AC
  Filled 2017-07-07: qty 20

## 2017-07-07 MED ORDER — MEPERIDINE HCL 25 MG/ML IJ SOLN: 6.2500 mg | INTRAMUSCULAR | Status: DC | PRN

## 2017-07-07 MED ORDER — METOCLOPRAMIDE HCL 5 MG/ML IJ SOLN: 10.0000 mg | Freq: Once | INTRAMUSCULAR | Status: DC | PRN

## 2017-07-07 MED ORDER — PROPOFOL 10 MG/ML IV BOLUS
INTRAVENOUS | Status: DC | PRN
  Administered 2017-07-07: 200 mg via INTRAVENOUS

## 2017-07-07 MED ORDER — GLYCOPYRROLATE 0.2 MG/ML IJ SOLN
INTRAMUSCULAR | Status: AC
  Filled 2017-07-07: qty 1

## 2017-07-07 MED ORDER — SUCCINYLCHOLINE CHLORIDE 20 MG/ML IJ SOLN
INTRAMUSCULAR | Status: DC | PRN
  Administered 2017-07-07: 120 mg via INTRAVENOUS

## 2017-07-07 SURGICAL SUPPLY — 19 items
CATH ROBINSON RED A/P 16FR (CATHETERS) ×3 IMPLANT
DECANTER SPIKE VIAL GLASS SM (MISCELLANEOUS) ×3 IMPLANT
GLOVE BIO SURGEON STRL SZ7.5 (GLOVE) ×3 IMPLANT
GLOVE BIOGEL PI IND STRL 7.0 (GLOVE) ×1 IMPLANT
GLOVE BIOGEL PI IND STRL 7.5 (GLOVE) ×2 IMPLANT
GLOVE BIOGEL PI INDICATOR 7.0 (GLOVE) ×2
GLOVE BIOGEL PI INDICATOR 7.5 (GLOVE) ×4
GOWN STRL REUS W/TWL LRG LVL3 (GOWN DISPOSABLE) ×9 IMPLANT
KIT BERKELEY 1ST TRIMESTER 3/8 (MISCELLANEOUS) ×3 IMPLANT
NS IRRIG 1000ML POUR BTL (IV SOLUTION) ×3 IMPLANT
PACK VAGINAL MINOR WOMEN LF (CUSTOM PROCEDURE TRAY) ×3 IMPLANT
PAD OB MATERNITY 4.3X12.25 (PERSONAL CARE ITEMS) ×3 IMPLANT
PAD PREP 24X48 CUFFED NSTRL (MISCELLANEOUS) ×3 IMPLANT
SET BERKELEY SUCTION TUBING (SUCTIONS) ×3 IMPLANT
TOWEL OR 17X24 6PK STRL BLUE (TOWEL DISPOSABLE) ×6 IMPLANT
VACURETTE 10 RIGID CVD (CANNULA) IMPLANT
VACURETTE 7MM CVD STRL WRAP (CANNULA) IMPLANT
VACURETTE 8 RIGID CVD (CANNULA) IMPLANT
VACURETTE 9 RIGID CVD (CANNULA) IMPLANT

## 2017-07-07 NOTE — MAU Provider Note (Signed)
History     CSN: [id number]  Arrival date and time: 07/07/17 1314   First Provider Initiated Contact with Patient 07/07/17 1323      Chief Complaint  Patient presents with  . Abdominal Pain  . Vaginal Bleeding   HPI Rhonda Shannon is 41 y.o. [id number] [redacted]w[redacted]d weeks presenting with heavy vaginal bleeding,abdominal cramping/pain and passing clots at work.  She was seen this am by E. Lowell Guitar, PA in the [place] office.  She was told, reported by RN, heartbeat was not seen and that the pregnancy was measuring 6 weeks.  By dates she should be [redacted]w[redacted]d. Husband reports she "passed out" in the car for "about 2 seconds".  He denied that she hit her head and did not have injuries.  She is very  Uncomfortable= rating her pain as a 10/10.  Hx of 2 C-sections.  Past Medical History:  Diagnosis Date  . Anemia   . Anxiety   . Depression     Past Surgical History:  Procedure Laterality Date  . CESAREAN SECTION  2000, 2003  . THERAPEUTIC ABORTION      Family History  Problem Relation Age of Onset  . Hypertension Mother        age dx in her 66's  . Hyperlipidemia Mother   . Stroke Maternal Grandmother   . Dementia Maternal Grandmother   . Aneurysm Maternal Grandfather   . Cancer Father   . Hypertension Father   . Diabetes Paternal Grandmother     Social History  Substance Use Topics  . Smoking status: Former Smoker    Packs/day: 1.00    Years: 16.00    Types: Cigarettes    Quit date: 10/07/2010  . Smokeless tobacco: Never Used  . Alcohol use 2.4 oz/week    4 Standard drinks or equivalent per week     Comment: beer and wine 2x/wk    Allergies: No Known Allergies  Prescriptions Prior to Admission  Medication Sig Dispense Refill Last Dose  . acetaminophen (TYLENOL) 325 MG tablet Take 650 mg by mouth once.   Past Week at Unknown time  . aspirin EC 81 MG tablet Take 81 mg by mouth daily.   07/07/2017 at Unknown time  . Prenatal Vit-Fe Fumarate-FA (PRENATAL MULTIVITAMIN) TABS tablet  Take 1 tablet by mouth daily at 12 noon.   07/06/2017 at Unknown time    Review of Systems  Constitutional: Negative for fever.  Gastrointestinal: Positive for abdominal pain (lower).  Genitourinary: Positive for pelvic pain and vaginal bleeding.  Neurological: Positive for syncope. Negative for dizziness.   Physical Exam   Blood pressure 96/62, pulse 90, temperature 98.4 F (36.9 C), temperature source Oral, resp. rate 20, last menstrual period 05/01/2017, SpO2 99 %.  Physical Exam  Constitutional: She is oriented to person, place, and time. She appears well-developed and well-nourished. No distress.  Uncomfortable.   HENT:  Head: Normocephalic.  Neck: Normal range of motion.  Cardiovascular: Normal rate.   Respiratory: Effort normal.  GI: Soft. She exhibits no distension and no mass. There is tenderness. There is no rebound and no guarding.  Genitourinary: There is no rash, tenderness or lesion on the right labia. There is no rash, tenderness or lesion on the left labia. Uterus is enlarged (7-8 week size) and tender (moderately). Cervix exhibits no motion tenderness, no discharge and no friability. Right adnexum displays no mass, no tenderness and no fullness. Left adnexum displays no mass, no tenderness and no fullness. There is bleeding (moderate  amount of vaginal bleeding with 2 very large clots removed from the vaginal.  ) in the vagina. No tenderness in the vagina.  Genitourinary Comments: Cervix, fingertip dilated.  Neurological: She is alert and oriented to person, place, and time.  Skin: Skin is warm and dry.  Psychiatric: She has a normal mood and affect. Her behavior is normal. Thought content normal.   Results for orders placed or performed during the hospital encounter of 07/07/17 (from the past 24 hour(s))  CBC with Differential/Platelet     Status: Abnormal   Collection Time: 07/07/17  1:43 PM  Result Value Ref Range   WBC 5.4 4.0 - 10.5 K/uL   RBC 3.66 (L) 3.87 -  5.11 MIL/uL   Hemoglobin 10.5 (L) 12.0 - 15.0 g/dL   HCT 16.1 (L) 09.6 - 04.5 %   MCV 87.2 78.0 - 100.0 fL   MCH 28.7 26.0 - 34.0 pg   MCHC 32.9 30.0 - 36.0 g/dL   RDW 40.9 81.1 - 91.4 %   Platelets 275 150 - 400 K/uL   Neutrophils Relative % 62 %   Neutro Abs 3.4 1.7 - 7.7 K/uL   Lymphocytes Relative 33 %   Lymphs Abs 1.8 0.7 - 4.0 K/uL   Monocytes Relative 3 %   Monocytes Absolute 0.2 0.1 - 1.0 K/uL   Eosinophils Relative 2 %   Eosinophils Absolute 0.1 0.0 - 0.7 K/uL   Basophils Relative 0 %   Basophils Absolute 0.0 0.0 - 0.1 K/uL  ABO/Rh     Status: None (Preliminary result)   Collection Time: 07/07/17  1:43 PM  Result Value Ref Range   ABO/RH(D) A POS     Measurements of blood loss  13:25     Pad #1 100cc  On arrival   15:11    Pad #2  152cc  After exam  15:54    Pad #3   80cc   After U/S   US Ob Comp Less 14 Wks  Result Date: 07/07/2017 CLINICAL DATA:  41 year old pregnant female presents with heavy vaginal bleeding with passage of clots. Size greater than dates. No fetal heart tones in the office this morning. Quantitative beta HCG level not available at the time of this dictation. EDC by LMP: 02/05/2018, projecting to an expected gestational age of [redacted] weeks 4 days. EXAM: OBSTETRIC <14 WK [place] AND TRANSVAGINAL OB US TECHNIQUE: Both transabdominal and transvaginal ultrasound examinations were performed for complete evaluation of the gestation as well as the maternal uterus, adnexal regions, and pelvic cul-de-sac. Transvaginal technique was performed to assess early pregnancy. COMPARISON:  No prior scans from this gestation. FINDINGS: Intrauterine gestational sac: There is an irregular gestational sac in the lower uterine segment/ upper endocervical canal. Yolk sac:  Visualized. Embryo: Heterogeneous eccentric echoes within the gestational sac, with no discrete embryo. Embryonic Cardiac Activity: Not Visualized. MSD: 15.4  mm   6 w   2  d Subchorionic hemorrhage:  None visualized.  Maternal uterus/adnexae: Left ovary measures 2.4 x 1.9 x 1.8 cm and contains a simple 2.0 cm cyst. Right ovary measures 2.5 x 1.5 x 1.2 cm. No suspicious ovarian or adnexal masses. No abnormal free fluid in the pelvis. Endometrium is heterogeneous in echotexture. No uterine fibroids. IMPRESSION: 1. Single irregular gestational sac located in the lower uterine segment/upper endocervical canal, containing a yolk sac with no discrete embryo or embryonic cardiac activity detected, measuring 6 weeks 2 days by mean sac diameter, which is discordant with the provided menstrual  dating. Endometrium appears heterogeneous, suggestive of blood products. These findings are very likely to represent a spontaneous abortion in progress. By consensus criteria, a follow-up obstetric scan is recommended in 11-14 days for definitive assessment of pregnancy viability. 2. Simple 2.0 cm left ovarian cyst, probably a corpus luteal cyst. No suspicious adnexal findings. Electronically Signed   By: Delbert Phenix M.D.   On: 07/07/2017 16:06   US Ob Transvaginal  Result Date: 07/07/2017 CLINICAL DATA:  41 year old pregnant female presents with heavy vaginal bleeding with passage of clots. Size greater than dates. No fetal heart tones in the office this morning. Quantitative beta HCG level not available at the time of this dictation. EDC by LMP: 02/05/2018, projecting to an expected gestational age of [redacted] weeks 4 days. EXAM: OBSTETRIC <14 WK [place] AND TRANSVAGINAL OB US TECHNIQUE: Both transabdominal and transvaginal ultrasound examinations were performed for complete evaluation of the gestation as well as the maternal uterus, adnexal regions, and pelvic cul-de-sac. Transvaginal technique was performed to assess early pregnancy. COMPARISON:  No prior scans from this gestation. FINDINGS: Intrauterine gestational sac: There is an irregular gestational sac in the lower uterine segment/ upper endocervical canal. Yolk sac:  Visualized. Embryo:  Heterogeneous eccentric echoes within the gestational sac, with no discrete embryo. Embryonic Cardiac Activity: Not Visualized. MSD: 15.4  mm   6 w   2  d Subchorionic hemorrhage:  None visualized. Maternal uterus/adnexae: Left ovary measures 2.4 x 1.9 x 1.8 cm and contains a simple 2.0 cm cyst. Right ovary measures 2.5 x 1.5 x 1.2 cm. No suspicious ovarian or adnexal masses. No abnormal free fluid in the pelvis. Endometrium is heterogeneous in echotexture. No uterine fibroids. IMPRESSION: 1. Single irregular gestational sac located in the lower uterine segment/upper endocervical canal, containing a yolk sac with no discrete embryo or embryonic cardiac activity detected, measuring 6 weeks 2 days by mean sac diameter, which is discordant with the provided menstrual dating. Endometrium appears heterogeneous, suggestive of blood products. These findings are very likely to represent a spontaneous abortion in progress. By consensus criteria, a follow-up obstetric scan is recommended in 11-14 days for definitive assessment of pregnancy viability. 2. Simple 2.0 cm left ovarian cyst, probably a corpus luteal cyst. No suspicious adnexal findings. Electronically Signed   By: Delbert Phenix M.D.   On: 07/07/2017 16:06   MAU Course  Procedures  MDM 12:26  Reported MSE to Dr. Su Hilt.  She will check on labor patient and call back to instruct on who will see patient 12"30  Dr. Su Hilt called back asking me  to see patient, she has a delivery. MSE completed Lactated Ringers IV infusions begun Labs Medication-Dilaudid  IV for pain 14:22  Checked with patient to see how she was feeling after pain medication given.  She is feeling better.  Check new pad--+ for blood without clot.  14:40  Update given to Dr.Rhyder Koegel.  Will get U/S 16:18  Discussed treatment options with the patient.  She is very uncomfortable.  Dilaudid  IV ordered. Time given for  she and her husband time to decide if she wants D&C.Marland Kitchen  Last ate at  12:00 (noon)--chick filet sandwich.   16:30  Patient opted for D&C, Dr. Su Hilt notified.    Assessment and Plan  A"  Vaginal bleeding in first trimester pregnancy       Impending Spontaneous Abortion   Eve M Key 07/07/2017, 3:50 PM   Agree with above.  I discussed options, risks, benefits and alternatives.  Pt  would like to proceed with surgical mgmt.  R/b/a of Suction D&C/D&E reviewed.  Questions answered.  Exam performed as well. Cervix is closed with moderate to heavy bleeding passing clots.  Pt reports passing out earlier.  Will get type and hold.

## 2017-07-07 NOTE — Discharge Instructions (Signed)

## 2017-07-07 NOTE — Transfer of Care (Signed)
Immediate Anesthesia Transfer of Care Note  Patient: Rhonda Shannon  Procedure(s) Performed: DILATATION AND EVACUATION (N/A Vagina )  Patient Location: PACU  Anesthesia Type:General  Level of Consciousness: awake, alert  and oriented  Airway & Oxygen Therapy: Patient Spontanous Breathing and Patient connected to nasal cannula oxygen  Post-op Assessment: Report given to RN and Post -op Vital signs reviewed and stable  Post vital signs: Reviewed and stable  Last Vitals:  Vitals:   07/07/17 1322 07/07/17 1726  BP:  107/64  Pulse:  86  Resp:  18  Temp:  36.7 C  SpO2: 99%     Last Pain:  Vitals:   07/07/17 1726  TempSrc: Oral  PainSc:          Complications: No apparent anesthesia complications

## 2017-07-07 NOTE — MAU Note (Signed)
Pt to OR.

## 2017-07-07 NOTE — Anesthesia Preprocedure Evaluation (Signed)
Anesthesia Evaluation  Patient identified by MRN, date of birth, ID band Patient awake    Reviewed: Allergy & Precautions, NPO status , Patient's Chart, lab work & pertinent test results  Airway Mallampati: II  TM Distance: >3 FB Neck ROM: Full    Dental no notable dental hx.    Pulmonary former smoker,    Pulmonary exam normal breath sounds clear to auscultation       Cardiovascular negative cardio ROS Normal cardiovascular exam Rhythm:Regular Rate:Normal     Neuro/Psych negative neurological ROS  negative psych ROS   GI/Hepatic negative GI ROS, Neg liver ROS,   Endo/Other  negative endocrine ROS  Renal/GU negative Renal ROS  negative genitourinary   Musculoskeletal negative musculoskeletal ROS (+)   Abdominal   Peds negative pediatric ROS (+)  Hematology negative hematology ROS (+)   Anesthesia Other Findings   Reproductive/Obstetrics negative OB ROS                             Anesthesia Physical Anesthesia Plan  ASA: II  Anesthesia Plan: General   Post-op Pain Management:    Induction: Intravenous, Rapid sequence and Cricoid pressure planned  PONV Risk Score and Plan: 4 or greater and Ondansetron, Dexamethasone, Midazolam, Scopolamine patch - Pre-op and Treatment may vary due to age or medical condition  Airway Management Planned: Oral ETT  Additional Equipment:   Intra-op Plan:   Post-operative Plan: Extubation in OR  Informed Consent: I have reviewed the patients History and Physical, chart, labs and discussed the procedure including the risks, benefits and alternatives for the proposed anesthesia with the patient or authorized representative who has indicated his/her understanding and acceptance.   Dental advisory given  Plan Discussed with: CRNA  Anesthesia Plan Comments:         Anesthesia Quick Evaluation

## 2017-07-07 NOTE — MAU Note (Signed)
Pt was at the office earlier today because she had some bleeding earlier. Ultrasound was done and no FHR seen and baby measuring 6w. Pt went to work and had lots of bleeding and cramping and had her husband get her to bring her here. On the way the husband stated she passed out for a few seconds. Pt was having moderate to large bleeding with clots when she arrived.

## 2017-07-07 NOTE — Op Note (Signed)
Preop Diagnosis: Incomplete Abortion @ 9 weeks   Postop Diagnosis: Incomplete Abortion @ 9 weeks   Procedure: DILATATION AND EVACUATION   Anesthesia: General   Anesthesiologist: Dr. Fransisco Beau  Attending: Everett Graff, MD   Assistant: N/a  Findings: Mod POCs  Pathology: POCs  Fluids: 800 cc  UOP: 125cc  EBL: 354 cc  Complications: None  Procedure: The patient was taken to the operating room after the risks benefits and alternatives were discussed with the patient, the patient verbalized understanding and consent signed and witnessed.  The patient was placed under MAC anesthesia, prepped and draped in the normal sterile fashion and a time out was performed.  A bivalve speculum was placed in the patient's vagina and the anterior lip of the cervix grasped with a single-tooth tenaculum. A paracervical block was administered using a total of 10 cc of 2% lidocaine.  The uterus was sounded to 11 cm and a size 9 suction curette was used. Suction curettage was performed until minimal tissue returned. Sharp curettage was performed until a gritty texture was noted. Suction curettage was performed once again to remove any remaining debris. All instruments were removed. The count was correct. The patient was transferred to the recovery room in good condition.

## 2017-07-07 NOTE — Anesthesia Postprocedure Evaluation (Signed)
Anesthesia Post Note  Patient: Rhonda Shannon  Procedure(s) Performed: DILATATION AND EVACUATION (N/A Vagina )     Patient location during evaluation: PACU Anesthesia Type: General Level of consciousness: awake and alert Pain management: pain level controlled Vital Signs Assessment: post-procedure vital signs reviewed and stable Respiratory status: spontaneous breathing, nonlabored ventilation and respiratory function stable Cardiovascular status: blood pressure returned to baseline and stable Postop Assessment: no apparent nausea or vomiting Anesthetic complications: no    Last Vitals:  Vitals:   07/07/17 1845 07/07/17 1900  BP: 111/86 103/72  Pulse: 84 88  Resp: 16 20  Temp:    SpO2: 100% 98%    Last Pain:  Vitals:   07/07/17 1845  TempSrc:   PainSc: 0-No pain   Pain Goal:                 Beryle Lathe

## 2017-07-07 NOTE — Anesthesia Procedure Notes (Signed)
Procedure Name: Intubation Date/Time: 07/07/2017 5:54 PM Performed by: Junious Silk Pre-anesthesia Checklist: Patient identified, Emergency Drugs available, Suction available, Patient being monitored and Timeout performed Patient Re-evaluated:Patient Re-evaluated prior to induction Oxygen Delivery Method: Circle system utilized Preoxygenation: Pre-oxygenation with 100% oxygen Induction Type: IV induction, Rapid sequence and Cricoid Pressure applied Laryngoscope Size: Miller and 2 Grade View: Grade I Tube type: Oral Tube size: 7.0 mm Number of attempts: 1 Airway Equipment and Method: Stylet Placement Confirmation: ETT inserted through vocal cords under direct vision,  positive ETCO2,  CO2 detector and breath sounds checked- equal and bilateral Secured at: 22 cm Tube secured with: Tape Dental Injury: Teeth and Oropharynx as per pre-operative assessment

## 2017-07-08 ENCOUNTER — Encounter (HOSPITAL_COMMUNITY): Payer: Self-pay | Admitting: Obstetrics and Gynecology

## 2017-07-15 NOTE — H&P (Signed)
Chief Complaint  Patient presents with  . Abdominal Pain  . Vaginal Bleeding    HPI Rhonda Shannon is 41 y.o. [id number] [redacted]w[redacted]d weeks presenting with heavy vaginal bleeding,abdominal cramping/pain and passing clots at work.  She was seen this am by E. Lowell Guitar, PA in the [place] office.  She was told, reported by RN, heartbeat was not seen and that the pregnancy was measuring 6 weeks.  By dates she should be [redacted]w[redacted]d. Husband reports she "passed out" in the car for "about 2 seconds".  He denied that she hit her head and did not have injuries.  She is very  Uncomfortable= rating her pain as a 10/10.  Hx of 2 C-sections.       Past Medical History:  Diagnosis Date  . Anemia    . Anxiety    . Depression             Past Surgical History:  Procedure Laterality Date  . CESAREAN SECTION   2000, 2003  . THERAPEUTIC ABORTION               Family History  Problem Relation Age of Onset  . Hypertension Mother          age dx in her 18's  . Hyperlipidemia Mother    . Stroke Maternal Grandmother    . Dementia Maternal Grandmother    . Aneurysm Maternal Grandfather    . Cancer Father    . Hypertension Father    . Diabetes Paternal Grandmother               Social History  Substance Use Topics  . Smoking status: Former Smoker      Packs/day: 1.00      Years: 16.00      Types: Cigarettes      Quit date: 10/07/2010  . Smokeless tobacco: Never Used  . Alcohol use 2.4 oz/week       4 Standard drinks or equivalent per week         Comment: beer and wine 2x/wk      Allergies: No Known Allergies          Prescriptions Prior to Admission  Medication Sig Dispense Refill Last Dose  . acetaminophen (TYLENOL) 325 MG tablet Take 650 mg by mouth once.     Past Week at Unknown time  . aspirin EC 81 MG tablet Take 81 mg by mouth daily.     07/07/2017 at Unknown time  . Prenatal Vit-Fe Fumarate-FA (PRENATAL MULTIVITAMIN) TABS tablet Take 1 tablet by mouth daily at 12 noon.     07/06/2017 at  Unknown time      Review of Systems  Constitutional: Negative for fever.  Gastrointestinal: Positive for abdominal pain (lower).  Genitourinary: Positive for pelvic pain and vaginal bleeding.  Neurological: Positive for syncope. Negative for dizziness.    Physical Exam    Blood pressure 96/62, pulse 90, temperature 98.4 F (36.9 C), temperature source Oral, resp. rate 20, last menstrual period 05/01/2017, SpO2 99 %.   Physical Exam  Constitutional: She is oriented to person, place, and time. She appears well-developed and well-nourished. No distress.  Uncomfortable.   HENT:  Head: Normocephalic.  Neck: Normal range of motion.  Cardiovascular: Normal rate.   Respiratory: Effort normal.  GI: Soft. She exhibits no distension and no mass. There is tenderness. There is no rebound and no guarding.  Genitourinary: There is no rash, tenderness or lesion on the right labia. There is no  rash, tenderness or lesion on the left labia. Uterus is enlarged (7-8 week size) and tender (moderately). Cervix exhibits no motion tenderness, no discharge and no friability. Right adnexum displays no mass, no tenderness and no fullness. Left adnexum displays no mass, no tenderness and no fullness. There is bleeding (moderate amount of vaginal bleeding with 2 very large clots removed from the vaginal.  ) in the vagina. No tenderness in the vagina.  Genitourinary Comments: Cervix, fingertip dilated.  Neurological: She is alert and oriented to person, place, and time.  Skin: Skin is warm and dry.  Psychiatric: She has a normal mood and affect. Her behavior is normal. Thought content normal.    Lab Results Last 24 Hours  Results for orders placed or performed during the hospital encounter of 07/07/17 (from the past 24 hour(s))  CBC with Differential/Platelet     Status: Abnormal    Collection Time: 07/07/17  1:43 PM  Result Value Ref Range    WBC 5.4 4.0 - 10.5 K/uL    RBC 3.66 (L) 3.87 - 5.11 MIL/uL     Hemoglobin 10.5 (L) 12.0 - 15.0 g/dL    HCT 16.1 (L) 09.6 - 46.0 %    MCV 87.2 78.0 - 100.0 fL    MCH 28.7 26.0 - 34.0 pg    MCHC 32.9 30.0 - 36.0 g/dL    RDW 04.5 40.9 - 81.1 %    Platelets 275 150 - 400 K/uL    Neutrophils Relative % 62 %    Neutro Abs 3.4 1.7 - 7.7 K/uL    Lymphocytes Relative 33 %    Lymphs Abs 1.8 0.7 - 4.0 K/uL    Monocytes Relative 3 %    Monocytes Absolute 0.2 0.1 - 1.0 K/uL    Eosinophils Relative 2 %    Eosinophils Absolute 0.1 0.0 - 0.7 K/uL    Basophils Relative 0 %    Basophils Absolute 0.0 0.0 - 0.1 K/uL  ABO/Rh     Status: None (Preliminary result)    Collection Time: 07/07/17  1:43 PM  Result Value Ref Range    ABO/RH(D) A POS        Measurements of blood loss  13:25     Pad #1 100cc  On arrival   15:11    Pad #2  152cc  After exam  15:54    Pad #3   80cc   After U/S     US Ob Comp Less 14 Wks   Result Date: 07/07/2017 CLINICAL DATA:  40 year old pregnant female presents with heavy vaginal bleeding with passage of clots. Size greater than dates. No fetal heart tones in the office this morning. Quantitative beta HCG level not available at the time of this dictation. EDC by LMP: 02/05/2018, projecting to an expected gestational age of [redacted] weeks 4 days. EXAM: OBSTETRIC <14 WK [place] AND TRANSVAGINAL OB US TECHNIQUE: Both transabdominal and transvaginal ultrasound examinations were performed for complete evaluation of the gestation as well as the maternal uterus, adnexal regions, and pelvic cul-de-sac. Transvaginal technique was performed to assess early pregnancy. COMPARISON:  No prior scans from this gestation. FINDINGS: Intrauterine gestational sac: There is an irregular gestational sac in the lower uterine segment/ upper endocervical canal. Yolk sac:  Visualized. Embryo: Heterogeneous eccentric echoes within the gestational sac, with no discrete embryo. Embryonic Cardiac Activity: Not Visualized. MSD: 15.4  mm   6 w   2  d Subchorionic hemorrhage:  None  visualized. Maternal uterus/adnexae: Left ovary measures  2.4 x 1.9 x 1.8 cm and contains a simple 2.0 cm cyst. Right ovary measures 2.5 x 1.5 x 1.2 cm. No suspicious ovarian or adnexal masses. No abnormal free fluid in the pelvis. Endometrium is heterogeneous in echotexture. No uterine fibroids. IMPRESSION: 1. Single irregular gestational sac located in the lower uterine segment/upper endocervical canal, containing a yolk sac with no discrete embryo or embryonic cardiac activity detected, measuring 6 weeks 2 days by mean sac diameter, which is discordant with the provided menstrual dating. Endometrium appears heterogeneous, suggestive of blood products. These findings are very likely to represent a spontaneous abortion in progress. By consensus criteria, a follow-up obstetric scan is recommended in 11-14 days for definitive assessment of pregnancy viability. 2. Simple 2.0 cm left ovarian cyst, probably a corpus luteal cyst. No suspicious adnexal findings. Electronically Signed   By: Delbert Phenix M.D.   On: 07/07/2017 16:06    US Ob Transvaginal   Result Date: 07/07/2017 CLINICAL DATA:  41 year old pregnant female presents with heavy vaginal bleeding with passage of clots. Size greater than dates. No fetal heart tones in the office this morning. Quantitative beta HCG level not available at the time of this dictation. EDC by LMP: 02/05/2018, projecting to an expected gestational age of [redacted] weeks 4 days. EXAM: OBSTETRIC <14 WK [place] AND TRANSVAGINAL OB US TECHNIQUE: Both transabdominal and transvaginal ultrasound examinations were performed for complete evaluation of the gestation as well as the maternal uterus, adnexal regions, and pelvic cul-de-sac. Transvaginal technique was performed to assess early pregnancy. COMPARISON:  No prior scans from this gestation. FINDINGS: Intrauterine gestational sac: There is an irregular gestational sac in the lower uterine segment/ upper endocervical canal. Yolk sac:  Visualized.  Embryo: Heterogeneous eccentric echoes within the gestational sac, with no discrete embryo. Embryonic Cardiac Activity: Not Visualized. MSD: 15.4  mm   6 w   2  d Subchorionic hemorrhage:  None visualized. Maternal uterus/adnexae: Left ovary measures 2.4 x 1.9 x 1.8 cm and contains a simple 2.0 cm cyst. Right ovary measures 2.5 x 1.5 x 1.2 cm. No suspicious ovarian or adnexal masses. No abnormal free fluid in the pelvis. Endometrium is heterogeneous in echotexture. No uterine fibroids. IMPRESSION: 1. Single irregular gestational sac located in the lower uterine segment/upper endocervical canal, containing a yolk sac with no discrete embryo or embryonic cardiac activity detected, measuring 6 weeks 2 days by mean sac diameter, which is discordant with the provided menstrual dating. Endometrium appears heterogeneous, suggestive of blood products. These findings are very likely to represent a spontaneous abortion in progress. By consensus criteria, a follow-up obstetric scan is recommended in 11-14 days for definitive assessment of pregnancy viability. 2. Simple 2.0 cm left ovarian cyst, probably a corpus luteal cyst. No suspicious adnexal findings. Electronically Signed   By: Delbert Phenix M.D.   On: 07/07/2017 16:06    MAU Course  Procedures   MDM 12:26  Reported MSE to Dr. Su Hilt.  She will check on labor patient and call back to instruct on who will see patient 12"30  Dr. Su Hilt called back asking me  to see patient, she has a delivery. MSE completed Lactated Ringers IV infusions begun Labs Medication-Dilaudid  IV for pain 14:22  Checked with patient to see how she was feeling after pain medication given.  She is feeling better.  Check new pad--+ for blood without clot.  14:40  Update given to Dr.Undrea Archbold.  Will get U/S 16:18  Discussed treatment options with the patient.  She  is very uncomfortable.  Dilaudid  IV ordered. Time given for  she and her husband time to decide if she wants D&C.Marland Kitchen  Last  ate at 12:00 (noon)--chick filet sandwich.   16:30  Patient opted for D&C, Dr. Su Hilt notified.     Assessment and Plan  A"  Vaginal bleeding in first trimester pregnancy       Impending Spontaneous Abortion     Eve M Key 07/07/2017, 3:50 PM    Agree with above.  I discussed options, risks, benefits and alternatives.  Pt would like to proceed with surgical mgmt.  R/b/a of Suction D&C/D&E reviewed.  Questions answered.  Exam performed as well. Cervix is closed with moderate to heavy bleeding passing clots.  Pt reports passing out earlier.  Will get type and hold.

## 2017-12-04 LAB — OB RESULTS CONSOLE ABO/RH: RH Type: POSITIVE

## 2017-12-04 LAB — OB RESULTS CONSOLE GC/CHLAMYDIA
Chlamydia: NEGATIVE
GC PROBE AMP, GENITAL: NEGATIVE

## 2017-12-04 LAB — OB RESULTS CONSOLE HEPATITIS B SURFACE ANTIGEN: HEP B S AG: NEGATIVE

## 2017-12-04 LAB — OB RESULTS CONSOLE RPR: RPR: NONREACTIVE

## 2017-12-04 LAB — OB RESULTS CONSOLE ANTIBODY SCREEN: ANTIBODY SCREEN: NEGATIVE

## 2017-12-04 LAB — OB RESULTS CONSOLE HIV ANTIBODY (ROUTINE TESTING): HIV: NONREACTIVE

## 2017-12-04 LAB — OB RESULTS CONSOLE RUBELLA ANTIBODY, IGM: RUBELLA: NON-IMMUNE/NOT IMMUNE

## 2018-04-30 ENCOUNTER — Encounter (HOSPITAL_COMMUNITY): Payer: Self-pay

## 2018-05-08 ENCOUNTER — Other Ambulatory Visit: Payer: Self-pay | Admitting: Obstetrics and Gynecology

## 2018-05-20 ENCOUNTER — Encounter (HOSPITAL_COMMUNITY): Payer: Self-pay | Admitting: *Deleted

## 2018-05-20 ENCOUNTER — Telehealth (HOSPITAL_COMMUNITY): Payer: Self-pay | Admitting: *Deleted

## 2018-05-20 NOTE — Telephone Encounter (Signed)
Preadmission screen  

## 2018-05-22 ENCOUNTER — Encounter (HOSPITAL_COMMUNITY): Payer: Self-pay

## 2018-06-02 ENCOUNTER — Encounter (HOSPITAL_COMMUNITY)
Admission: RE | Admit: 2018-06-02 | Discharge: 2018-06-02 | Disposition: A | Discharge status: Home / Self Care | Payer: BC Managed Care – PPO | Source: Ambulatory Visit | Attending: Obstetrics and Gynecology | Admitting: Obstetrics and Gynecology

## 2018-06-02 HISTORY — DX: Supervision of elderly multigravida, unspecified trimester: O09.529

## 2018-06-02 LAB — CBC
HCT: 32.6 % — ABNORMAL LOW (ref 36.0–46.0)
Hemoglobin: 10.7 g/dL — ABNORMAL LOW (ref 12.0–15.0)
MCH: 29.5 pg (ref 26.0–34.0)
MCHC: 32.8 g/dL (ref 30.0–36.0)
MCV: 89.8 fL (ref 78.0–100.0)
PLATELETS: 175 10*3/uL (ref 150–400)
RBC: 3.63 MIL/uL — ABNORMAL LOW (ref 3.87–5.11)
RDW: 13.3 % (ref 11.5–15.5)
WBC: 6 10*3/uL (ref 4.0–10.5)

## 2018-06-02 NOTE — Patient Instructions (Addendum)
Rhonda Shannon  06/02/2018   Your procedure is scheduled on:  06/03/2018  Enter through the Main [facility]trance of [facility] at 12:45 PM.  Pick up the phone at the desk and dial [phone]  Call this number if you have problems the morning of surgery:[phone]  Remember:   Do not eat food:(After Midnight) Desps de medianoche.  Do not drink clear liquids: (After Midnight) Desps de medianoche.  Take these medicines the morning of surgery with A SIP OF WATER: none   Do not wear jewelry, make-up or nail polish.  Do not wear lotions, powders, or perfumes. Do not wear deodorant.  Do not shave 48 hours prior to surgery.  Do not bring valuables to the hospital.  [facility] is not   responsible for any belongings or valuables brought to the hospital.  Contacts, dentures or bridgework may not be worn into surgery.  Leave suitcase in the car. After surgery it may be brought to your room.  For patients admitted to the hospital, checkout time is 11:00 AM the day of              discharge.    N/A   Please read over the following fact sheets that you were given:   Surgical Site Infection Prevention

## 2018-06-03 ENCOUNTER — Inpatient Hospital Stay (HOSPITAL_COMMUNITY)
Admission: RE | Admit: 2018-06-03 | Discharge: 2018-06-05 | DRG: 785 | Disposition: A | Discharge status: Home / Self Care | Payer: BC Managed Care – PPO | Attending: Obstetrics and Gynecology | Admitting: Obstetrics and Gynecology

## 2018-06-03 ENCOUNTER — Encounter (HOSPITAL_COMMUNITY): Payer: Self-pay

## 2018-06-03 ENCOUNTER — Inpatient Hospital Stay (HOSPITAL_COMMUNITY): Payer: BC Managed Care – PPO | Admitting: Anesthesiology

## 2018-06-03 ENCOUNTER — Encounter (HOSPITAL_COMMUNITY)
Admission: RE | Disposition: A | Discharge status: Home / Self Care | Payer: Self-pay | Source: Home / Self Care | Attending: Obstetrics and Gynecology

## 2018-06-03 DIAGNOSIS — O403XX0 Polyhydramnios, third trimester, not applicable or unspecified: Secondary | ICD-10-CM | POA: Diagnosis present

## 2018-06-03 DIAGNOSIS — O9902 Anemia complicating childbirth: Secondary | ICD-10-CM | POA: Diagnosis present

## 2018-06-03 DIAGNOSIS — D649 Anemia, unspecified: Secondary | ICD-10-CM | POA: Diagnosis present

## 2018-06-03 DIAGNOSIS — Z3A39 39 weeks gestation of pregnancy: Secondary | ICD-10-CM | POA: Diagnosis not present

## 2018-06-03 DIAGNOSIS — O34211 Maternal care for low transverse scar from previous cesarean delivery: Principal | ICD-10-CM | POA: Diagnosis present

## 2018-06-03 DIAGNOSIS — Z302 Encounter for sterilization: Secondary | ICD-10-CM

## 2018-06-03 DIAGNOSIS — Z98891 History of uterine scar from previous surgery: Secondary | ICD-10-CM

## 2018-06-03 DIAGNOSIS — Z87891 Personal history of nicotine dependence: Secondary | ICD-10-CM

## 2018-06-03 LAB — PREPARE RBC (CROSSMATCH)

## 2018-06-03 LAB — RPR: RPR Ser Ql: NONREACTIVE

## 2018-06-03 SURGERY — Surgical Case
Anesthesia: Regional | Laterality: Bilateral

## 2018-06-03 MED ORDER — ACETAMINOPHEN 10 MG/ML IV SOLN
1000.0000 mg | Freq: Once | INTRAVENOUS | Status: DC | PRN
  Administered 2018-06-03: 1000 mg via INTRAVENOUS

## 2018-06-03 MED ORDER — DIBUCAINE 1 % RE OINT: 1.0000 "application " | TOPICAL_OINTMENT | RECTAL | Status: DC | PRN

## 2018-06-03 MED ORDER — COCONUT OIL OIL: 1.0000 "application " | TOPICAL_OIL | Status: DC | PRN

## 2018-06-03 MED ORDER — SENNOSIDES-DOCUSATE SODIUM 8.6-50 MG PO TABS
2.0000 | ORAL_TABLET | ORAL | Status: DC
  Administered 2018-06-04 (×2): 2 via ORAL
  Filled 2018-06-03 (×3): qty 2

## 2018-06-03 MED ORDER — FENTANYL CITRATE (PF) 100 MCG/2ML IJ SOLN
INTRAMUSCULAR | Status: DC | PRN
  Administered 2018-06-03: 50 ug via INTRAVENOUS
  Administered 2018-06-03 (×2): 100 ug via INTRAVENOUS
  Administered 2018-06-03: 15 ug via INTRATHECAL

## 2018-06-03 MED ORDER — SCOPOLAMINE 1 MG/3DAYS TD PT72
MEDICATED_PATCH | TRANSDERMAL | Status: AC
  Filled 2018-06-03: qty 1

## 2018-06-03 MED ORDER — FENTANYL CITRATE (PF) 250 MCG/5ML IJ SOLN
INTRAMUSCULAR | Status: AC
  Filled 2018-06-03: qty 5

## 2018-06-03 MED ORDER — SODIUM CHLORIDE 0.9 % IR SOLN
Status: DC | PRN
  Administered 2018-06-03: 1

## 2018-06-03 MED ORDER — ACETAMINOPHEN 10 MG/ML IV SOLN
INTRAVENOUS | Status: AC
  Administered 2018-06-03: 1000 mg via INTRAVENOUS
  Filled 2018-06-03: qty 100

## 2018-06-03 MED ORDER — OXYTOCIN 40 UNITS IN LACTATED RINGERS INFUSION - SIMPLE MED: 2.5000 [IU]/h | INTRAVENOUS | Status: AC

## 2018-06-03 MED ORDER — OXYTOCIN 10 UNIT/ML IJ SOLN
INTRAMUSCULAR | Status: AC
  Filled 2018-06-03: qty 4

## 2018-06-03 MED ORDER — ONDANSETRON HCL 4 MG/2ML IJ SOLN
INTRAMUSCULAR | Status: AC
  Filled 2018-06-03: qty 2

## 2018-06-03 MED ORDER — SIMETHICONE 80 MG PO CHEW
80.0000 mg | CHEWABLE_TABLET | ORAL | Status: DC
  Administered 2018-06-04 (×2): 80 mg via ORAL
  Filled 2018-06-03: qty 1

## 2018-06-03 MED ORDER — ONDANSETRON HCL 4 MG/2ML IJ SOLN
INTRAMUSCULAR | Status: DC | PRN
  Administered 2018-06-03: 4 mg via INTRAVENOUS

## 2018-06-03 MED ORDER — OXYTOCIN 10 UNIT/ML IJ SOLN
INTRAVENOUS | Status: DC | PRN
  Administered 2018-06-03: 40 [IU] via INTRAVENOUS

## 2018-06-03 MED ORDER — SCOPOLAMINE 1 MG/3DAYS TD PT72
MEDICATED_PATCH | TRANSDERMAL | Status: DC | PRN
  Administered 2018-06-03: 1 via TRANSDERMAL

## 2018-06-03 MED ORDER — HYDROMORPHONE HCL 1 MG/ML IJ SOLN
INTRAMUSCULAR | Status: AC
  Filled 2018-06-03: qty 1

## 2018-06-03 MED ORDER — WITCH HAZEL-GLYCERIN EX PADS: 1.0000 "application " | MEDICATED_PAD | CUTANEOUS | Status: DC | PRN

## 2018-06-03 MED ORDER — LACTATED RINGERS IV SOLN
INTRAVENOUS | Status: DC
  Administered 2018-06-03: 15:00:00 via INTRAVENOUS

## 2018-06-03 MED ORDER — ZOLPIDEM TARTRATE 5 MG PO TABS: 5.0000 mg | ORAL_TABLET | Freq: Every evening | ORAL | Status: DC | PRN

## 2018-06-03 MED ORDER — PRENATAL MULTIVITAMIN CH
1.0000 | ORAL_TABLET | Freq: Every day | ORAL | Status: DC
  Administered 2018-06-04: 1 via ORAL
  Filled 2018-06-03: qty 1

## 2018-06-03 MED ORDER — SIMETHICONE 80 MG PO CHEW: 80.0000 mg | CHEWABLE_TABLET | ORAL | Status: DC | PRN

## 2018-06-03 MED ORDER — DIPHENHYDRAMINE HCL 25 MG PO CAPS
25.0000 mg | ORAL_CAPSULE | Freq: Four times a day (QID) | ORAL | Status: DC | PRN
  Administered 2018-06-04: 25 mg via ORAL
  Filled 2018-06-03 (×2): qty 1

## 2018-06-03 MED ORDER — DEXAMETHASONE SODIUM PHOSPHATE 4 MG/ML IJ SOLN
INTRAMUSCULAR | Status: DC | PRN
  Administered 2018-06-03: 4 mg via INTRAVENOUS

## 2018-06-03 MED ORDER — BUPIVACAINE IN DEXTROSE 0.75-8.25 % IT SOLN
INTRATHECAL | Status: DC | PRN
  Administered 2018-06-03: 1.8 mL via INTRATHECAL

## 2018-06-03 MED ORDER — SIMETHICONE 80 MG PO CHEW
80.0000 mg | CHEWABLE_TABLET | Freq: Three times a day (TID) | ORAL | Status: DC
  Administered 2018-06-04 – 2018-06-05 (×3): 80 mg via ORAL
  Filled 2018-06-03 (×4): qty 1

## 2018-06-03 MED ORDER — OXYCODONE HCL 5 MG PO TABS
5.0000 mg | ORAL_TABLET | ORAL | Status: DC | PRN
  Administered 2018-06-04 – 2018-06-05 (×2): 5 mg via ORAL
  Filled 2018-06-03 (×2): qty 1

## 2018-06-03 MED ORDER — DEXAMETHASONE SODIUM PHOSPHATE 4 MG/ML IJ SOLN
INTRAMUSCULAR | Status: AC
  Filled 2018-06-03: qty 1

## 2018-06-03 MED ORDER — HYDROMORPHONE HCL 1 MG/ML IJ SOLN
INTRAMUSCULAR | Status: DC | PRN
  Administered 2018-06-03: 1 mg via INTRAVENOUS

## 2018-06-03 MED ORDER — MENTHOL 3 MG MT LOZG: 1.0000 | LOZENGE | OROMUCOSAL | Status: DC | PRN

## 2018-06-03 MED ORDER — SODIUM BICARBONATE 8.4 % IV SOLN
INTRAVENOUS | Status: DC | PRN
  Administered 2018-06-03: 3 mL via EPIDURAL
  Administered 2018-06-03: 2 mL via EPIDURAL
  Administered 2018-06-03: 3 mL via EPIDURAL

## 2018-06-03 MED ORDER — PHENYLEPHRINE 8 MG IN D5W 100 ML (0.08MG/ML) PREMIX OPTIME
INJECTION | INTRAVENOUS | Status: AC
  Filled 2018-06-03: qty 100

## 2018-06-03 MED ORDER — CEFAZOLIN SODIUM-DEXTROSE 2-4 GM/100ML-% IV SOLN: 2.0000 g | INTRAVENOUS | Status: DC

## 2018-06-03 MED ORDER — MORPHINE SULFATE (PF) 0.5 MG/ML IJ SOLN
INTRAMUSCULAR | Status: DC | PRN
  Administered 2018-06-03: .15 mg via INTRATHECAL

## 2018-06-03 MED ORDER — FENTANYL CITRATE (PF) 100 MCG/2ML IJ SOLN
INTRAMUSCULAR | Status: AC
  Filled 2018-06-03: qty 2

## 2018-06-03 MED ORDER — TETANUS-DIPHTH-ACELL PERTUSSIS 5-2.5-18.5 LF-MCG/0.5 IM SUSP: 0.5000 mL | Freq: Once | INTRAMUSCULAR | Status: DC

## 2018-06-03 MED ORDER — CEFAZOLIN SODIUM-DEXTROSE 2-4 GM/100ML-% IV SOLN
2.0000 g | INTRAVENOUS | Status: AC
  Administered 2018-06-03: 2 g via INTRAVENOUS

## 2018-06-03 MED ORDER — SODIUM CHLORIDE 0.9% IV SOLUTION: Freq: Once | INTRAVENOUS | Status: DC

## 2018-06-03 MED ORDER — MORPHINE SULFATE (PF) 0.5 MG/ML IJ SOLN
INTRAMUSCULAR | Status: AC
  Filled 2018-06-03: qty 10

## 2018-06-03 MED ORDER — LIDOCAINE-EPINEPHRINE (PF) 2 %-1:200000 IJ SOLN
INTRAMUSCULAR | Status: AC
  Filled 2018-06-03: qty 20

## 2018-06-03 MED ORDER — MIDAZOLAM HCL 5 MG/5ML IJ SOLN
INTRAMUSCULAR | Status: DC | PRN
  Administered 2018-06-03 (×2): 1 mg via INTRAVENOUS

## 2018-06-03 MED ORDER — LACTATED RINGERS IV SOLN: INTRAVENOUS | Status: DC

## 2018-06-03 MED ORDER — IBUPROFEN 600 MG PO TABS
600.0000 mg | ORAL_TABLET | Freq: Four times a day (QID) | ORAL | Status: DC
  Administered 2018-06-04 – 2018-06-05 (×6): 600 mg via ORAL
  Filled 2018-06-03 (×7): qty 1

## 2018-06-03 MED ORDER — MIDAZOLAM HCL 2 MG/2ML IJ SOLN
INTRAMUSCULAR | Status: AC
  Filled 2018-06-03: qty 2

## 2018-06-03 MED ORDER — OXYCODONE HCL 5 MG PO TABS
10.0000 mg | ORAL_TABLET | ORAL | Status: DC | PRN
  Administered 2018-06-04 – 2018-06-05 (×2): 10 mg via ORAL
  Filled 2018-06-03 (×2): qty 2

## 2018-06-03 MED ORDER — PHENYLEPHRINE 8 MG IN D5W 100 ML (0.08MG/ML) PREMIX OPTIME
INJECTION | INTRAVENOUS | Status: DC | PRN
  Administered 2018-06-03: 60 ug/min via INTRAVENOUS

## 2018-06-03 MED ORDER — CEFAZOLIN SODIUM-DEXTROSE 2-4 GM/100ML-% IV SOLN
INTRAVENOUS | Status: AC
  Filled 2018-06-03: qty 100

## 2018-06-03 MED ORDER — ACETAMINOPHEN 325 MG PO TABS: 650.0000 mg | ORAL_TABLET | ORAL | Status: DC | PRN

## 2018-06-03 SURGICAL SUPPLY — 38 items
BENZOIN TINCTURE PRP APPL 2/3 (GAUZE/BANDAGES/DRESSINGS) ×3 IMPLANT
CHLORAPREP W/TINT 26ML (MISCELLANEOUS) ×3 IMPLANT
CLAMP CORD UMBIL (MISCELLANEOUS) IMPLANT
CLOSURE STERI-STRIP 1/2X4 (GAUZE/BANDAGES/DRESSINGS) ×1
CLOSURE WOUND 1/2 X4 (GAUZE/BANDAGES/DRESSINGS) ×1
CLOTH BEACON ORANGE TIMEOUT ST (SAFETY) ×3 IMPLANT
CLSR STERI-STRIP ANTIMIC 1/2X4 (GAUZE/BANDAGES/DRESSINGS) ×2 IMPLANT
DRSG OPSITE POSTOP 4X10 (GAUZE/BANDAGES/DRESSINGS) ×3 IMPLANT
ELECT REM PT RETURN 9FT ADLT (ELECTROSURGICAL) ×3
ELECTRODE REM PT RTRN 9FT ADLT (ELECTROSURGICAL) ×1 IMPLANT
EXTRACTOR VACUUM M CUP 4 TUBE (SUCTIONS) IMPLANT
EXTRACTOR VACUUM M CUP 4' TUBE (SUCTIONS)
GLOVE BIO SURGEON STRL SZ7.5 (GLOVE) ×3 IMPLANT
GLOVE BIOGEL PI IND STRL 7.0 (GLOVE) ×2 IMPLANT
GLOVE BIOGEL PI IND STRL 7.5 (GLOVE) ×1 IMPLANT
GLOVE BIOGEL PI INDICATOR 7.0 (GLOVE) ×4
GLOVE BIOGEL PI INDICATOR 7.5 (GLOVE) ×2
GOWN STRL REUS W/TWL LRG LVL3 (GOWN DISPOSABLE) ×9 IMPLANT
KIT ABG SYR 3ML LUER SLIP (SYRINGE) IMPLANT
NEEDLE HYPO 22GX1.5 SAFETY (NEEDLE) IMPLANT
NEEDLE HYPO 25X5/8 SAFETYGLIDE (NEEDLE) IMPLANT
NS IRRIG 1000ML POUR BTL (IV SOLUTION) ×3 IMPLANT
PACK C SECTION WH (CUSTOM PROCEDURE TRAY) ×3 IMPLANT
PAD OB MATERNITY 4.3X12.25 (PERSONAL CARE ITEMS) ×3 IMPLANT
PENCIL SMOKE EVAC W/HOLSTER (ELECTROSURGICAL) ×3 IMPLANT
RTRCTR C-SECT PINK 25CM LRG (MISCELLANEOUS) ×3 IMPLANT
STRIP CLOSURE SKIN 1/2X4 (GAUZE/BANDAGES/DRESSINGS) ×2 IMPLANT
SUT CHROMIC 2 0 CT 1 (SUTURE) ×3 IMPLANT
SUT MNCRL AB 3-0 PS2 27 (SUTURE) ×3 IMPLANT
SUT PLAIN 2 0 XLH (SUTURE) ×3 IMPLANT
SUT VIC AB 0 CT1 36 (SUTURE) ×3 IMPLANT
SUT VIC AB 0 CTX 36 (SUTURE) ×6
SUT VIC AB 0 CTX36XBRD ANBCTRL (SUTURE) ×3 IMPLANT
SUT VIC AB 2-0 SH 27 (SUTURE) ×4
SUT VIC AB 2-0 SH 27XBRD (SUTURE) ×2 IMPLANT
SYR CONTROL 10ML LL (SYRINGE) IMPLANT
TOWEL OR 17X24 6PK STRL BLUE (TOWEL DISPOSABLE) ×3 IMPLANT
TRAY FOLEY W/BAG SLVR 14FR LF (SET/KITS/TRAYS/PACK) ×3 IMPLANT

## 2018-06-03 NOTE — Interval H&P Note (Signed)
History and Physical Interval Note:  06/03/2018 2:47 PM  Rhonda Shannon  has presented today for surgery, with the diagnosis of Prior Cesarean Section with desire for sterilization  The various methods of treatment have been discussed with the patient and family. After consideration of risks, benefits and other options for treatment, the patient has consented to  Procedure(s): REPEAT CESAREAN SECTION WITH BILATERAL TUBAL LIGATION (Bilateral) as a surgical intervention .  The patient's history has been reviewed, patient examined, no change in status, stable for surgery.  I have reviewed the patient's chart and labs.  Questions were answered to the patient's satisfaction.     Purcell NailsAngela Y Brianna Bennett

## 2018-06-03 NOTE — H&P (Addendum)
Rhonda Shannon is a 42 y.o. female, [id number], IUP at 39.3 weeks, presenting for ERCS with bi-lateral tubaligation. Pt endorse + Fm. Denies vaginal leakage. Denies vaginal bleeding. Denies feeling cxt's. Please see below for pregnancy problem with HPI.  Patient Active Problem List   Diagnosis Date Noted  . 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 Unspecified vitamin D deficiency 05/11/2012  . History of anemia 05/11/2012  . History of anxiety disorder 05/11/2012  . History of tobacco use 05/11/2012   Pregnancy Problems deliveries by cesarean (2000 and 2003--desires repeat) Anemia (Hgb 9.8 at NOB, same at glucola-recommended to continue supplementation.) rubella non-immune (offer vaccines PP) depressive disorder (No meds--sees therapist.) advanced maternal age gravida (49 at delivery, Normal first trimester screen; antenatal testing from 36 weeks, delivery 39-40 weeks.) Anxiety (No meds--sees therapist.) fetal heart echogenicity on obstetric ultrasound scan Polyhydramnios (AFI 30.2 on 6/25, 30 weeks. Plan weekly BPPs/AFI, recheck growth at 34 weeks. AFI 24-23 AT 31 AND 32 WEEKS. 24 at 36 weeks, continue weekly BPPs.)  Medications Hematinic/Folic Acid Prenatal Vitamin B12 Medications Prior to Admission  Medication Sig Dispense Refill Last Dose  . calcium carbonate (TUMS - DOSED IN MG ELEMENTAL CALCIUM) 500 MG chewable tablet Chew 1 tablet by mouth daily as needed for indigestion or heartburn.     Marland Kitchen HEMATINIC/FOLIC ACID 324-1 MG TABS Take 1 tablet by mouth daily.  1   . Prenatal Vit-Fe Fumarate-FA (PRENATAL MULTIVITAMIN) TABS tablet Take 1 tablet by mouth daily at 12 noon.   06/02/2018 at Unknown time  . vitamin B-12 (CYANOCOBALAMIN) 1000 MCG tablet Take 1,000 mcg by mouth daily.     Marland Kitchen ibuprofen (ADVIL,MOTRIN) 600 MG tablet Take 1 tablet (600 mg total) by mouth every 6 (six) hours as needed. (Patient not taking: Reported on 05/27/2018) 30 tablet 0 Not Taking at Unknown  time  . oxycodone-acetaminophen (PERCOCET) 2.5-325 MG tablet Take 1 tablet by mouth every 4 (four) hours as needed for pain. (Patient not taking: Reported on 05/27/2018) 30 tablet 0 Not Taking at Unknown time    Past Medical History:  Diagnosis Date  . AMA (advanced maternal age) multigravida 35+   . Anemia   . Anxiety   . Depression      No current facility-administered medications on file prior to encounter.    Current Outpatient Medications on File Prior to Encounter  Medication Sig Dispense Refill  . calcium carbonate (TUMS - DOSED IN MG ELEMENTAL CALCIUM) 500 MG chewable tablet Chew 1 tablet by mouth daily as needed for indigestion or heartburn.    Marland Kitchen HEMATINIC/FOLIC ACID 324-1 MG TABS Take 1 tablet by mouth daily.  1  . Prenatal Vit-Fe Fumarate-FA (PRENATAL MULTIVITAMIN) TABS tablet Take 1 tablet by mouth daily at 12 noon.    . vitamin B-12 (CYANOCOBALAMIN) 1000 MCG tablet Take 1,000 mcg by mouth daily.    Marland Kitchen ibuprofen (ADVIL,MOTRIN) 600 MG tablet Take 1 tablet (600 mg total) by mouth every 6 (six) hours as needed. (Patient not taking: Reported on 05/27/2018) 30 tablet 0  . oxycodone-acetaminophen (PERCOCET) 2.5-325 MG tablet Take 1 tablet by mouth every 4 (four) hours as needed for pain. (Patient not taking: Reported on 05/27/2018) 30 tablet 0     No Known Allergies  History of present pregnancy: Pt Info/Preference:  Screening/Consents:  Labs:   EDD: Estimated Date of Delivery: 06/07/18  Establised: Patient's last menstrual period was 09/06/2017.  Anatomy Scan: Date: 01/30/2018 and repeat f/u on 03/03/2018 Placenta Location: anterior Genetic Screen: [place]:  AFP: Normal First Tri: Negative Quad:  Office: [facility]            Md: Dr. Su Hiltoberts First PNV: 12.5 WG Blood Type A/Positive/-- (02/28 0000)  Language: [place] Last PNV: 38.2 WG Rhogam    Flu Vaccine:  Declined   Antibody n (02/28 0000)  TDaP vaccine Received at PNV   GTT: Early: 4.7 hga1c Third Trimester: negative  Feeding  Plan: Breast BTL: Bi-lateral salpingectomy (No consent found on file)  Rubella: Nonimmune (02/28 0000)  Contraception: BTL with this c/s VBAC: No RPR: Non Reactive (08/27 1530)   Circumcision: No BG: S??? Abigal   HBsAg: Negative (02/28 0000)  Pediatrician:  [facility]   HIV: Non-reactive (02/28 0000)   Prenatal Classes: No Additional US: Growth scan and BPP (Last on 05/26/2018), repeat anatomy scan for limited views, see below.  GBS:  (For PCN allergy, check sensitivities)       Chlamydia: Neg    MFM Referral/Consult:  GC: Neg  Support Person: Husband   PAP: 2016-normal  Pain Management: Spinal epidural Neonatologist Referral:  Hgb Electrophoresis:  AA  Birth Plan: ERCS with BTL   Hgb NOB: 9.9    28W: 9.8  Repeat limited US for anatomy on 03/03/2018:  OB History    Gravida  6   Para  2   Term  2   Preterm      AB  3   Living  2     SAB  1   TAB  2   Ectopic      Multiple      Live Births  2          Past Medical History:  Diagnosis Date  . AMA (advanced maternal age) multigravida 35+   . Anemia   . Anxiety   . Depression    Past Surgical History:  Procedure Laterality Date  . CESAREAN SECTION  2000, 2003  . DILATION AND CURETTAGE OF UTERUS    . DILATION AND EVACUATION N/A 07/07/2017   Procedure: DILATATION AND EVACUATION;  Surgeon: Osborn Cohooberts, Otisha Spickler, MD;  Location: [facility];  Service: Gynecology;  Laterality: N/A;  . THERAPEUTIC ABORTION    . WISDOM TOOTH EXTRACTION     Family History: family history includes Aneurysm in her maternal grandfather; Cancer in her father; Dementia in her maternal grandmother; Diabetes in her paternal grandmother; Hyperlipidemia in her mother; Hypertension in her father and mother; Stroke in her maternal grandmother. Social History:  reports that she quit smoking about 7 years ago. Her smoking use included cigarettes. She has a 16.00 pack-year smoking history. She has never used smokeless tobacco. She reports that she drinks about  4.0 standard drinks of alcohol per week. She reports that she does not use drugs.   Prenatal Transfer Tool  Maternal Diabetes: No Genetic Screening: Normal Maternal Ultrasounds/Referrals: Normal Fetal Ultrasounds or other Referrals:  Other: LVEIF noted on repeat anatomy scan on 03/03/2018 Maternal Substance Abuse:  No Significant Maternal Medications:  None Significant Maternal Lab Results: None  ROS:  Review of Systems  All other systems reviewed and are negative.    Physical Exam: BP (!) 104/58   Pulse 74   Temp 98.4 F (36.9 C) (Oral)   Resp 16   Ht 5\' 4"  (1.626 m)   Wt 77.3 kg   LMP 09/06/2017   BMI 29.25 kg/m   Physical Exam  Constitutional: She is oriented to person, place, and time and well-developed, well-nourished, and in no distress.  HENT:  Head: Normocephalic and atraumatic.  Eyes: Pupils are equal, round, and reactive to light. Conjunctivae are normal.  Neck: Normal range of motion. Neck supple.  Cardiovascular: Normal rate and regular rhythm.  Pulmonary/Chest: Effort normal and breath sounds normal.  Abdominal: Soft. Bowel sounds are normal.  Genitourinary: Uterus normal.  Genitourinary Comments: Uterus: Non-tender, soft, gravida equal to dates.   Musculoskeletal: Normal range of motion.  Neurological: She is alert and oriented to person, place, and time. She has normal reflexes. Gait normal.  Skin: Skin is warm and dry.  Psychiatric: Affect normal.  Nursing note and vitals reviewed.    NST: FHR baseline 135 bpm UC:   none  Leopold's: Position vertex, EFW 7lbs 8oz via leopold's.   Labs: Results for orders placed or performed during the hospital encounter of 06/02/18 (from the past 24 hour(s))  CBC     Status: Abnormal   Collection Time: 06/02/18  3:30 PM  Result Value Ref Range   WBC 6.0 4.0 - 10.5 K/uL   RBC 3.63 (L) 3.87 - 5.11 MIL/uL   Hemoglobin 10.7 (L) 12.0 - 15.0 g/dL   HCT 82.9 (L) 56.2 - 13.0 %   MCV 89.8 78.0 - 100.0 fL   MCH 29.5  26.0 - 34.0 pg   MCHC 32.8 30.0 - 36.0 g/dL   RDW 86.5 78.4 - 69.6 %   Platelets 175 150 - 400 K/uL  RPR     Status: None   Collection Time: 06/02/18  3:30 PM  Result Value Ref Range   RPR Ser Ql Non Reactive Non Reactive    Imaging:  No results found.  MAU Course: Orders Placed This Encounter  Procedures  . Informed Consent Details: Transcribe to consent form and obtain patient signature  . Initiate Pre-op Protocol  . Clip operative site  . Pre-admission testing diagnosis  . No pregnance test required  . SCD's  . Type and screen [facility]  . Insert peripheral IV   Meds ordered this encounter  Medications  . lactated ringers infusion  . DISCONTD: ceFAZolin (ANCEF) IVPB 2g/100 mL premix    Order Specific Question:   Indication:    Answer:   Surgical Prophylaxis  . ceFAZolin (ANCEF) IVPB 2g/100 mL premix    Order Specific Question:   Indication:    Answer:   Surgical Prophylaxis    Assessment/Plan: KASY IANNACONE is a 42 y.o. female, [id number], IUP at 39.3 weeks, presenting for ERCS for AMA (42) & h/o polyhydramnios this regnancy and anemia (pre-op hgb on 06/13 Was 9.8). Pt desires BILATERAL SALPINGECTOMY, no consent see on file. On 08/20: BPP: EFW 7 lbs 8 oz 60% with normal AFI and BPP 8/8 vertex, anterior placenta. GBS Negative. FWB: Cat 1 Fetal Tracing. Pt stable.  Plan: Admit to Birthing Suite per consult with Dr Su Hilt Routine [facility] pre-op orders Spinal epidural Anticipate Cs delivery.   Varicella Non-Immune: Offer vaccine PP.   MONTANA, JADE NP-C, CNM, MSN 06/03/2018, 1:12 PM   Risks, Benefits, Alternatives discussed with patient including but not limited to bleeding, infection and injury.  Questions answered and consent signed and witnessed.

## 2018-06-03 NOTE — Anesthesia Postprocedure Evaluation (Signed)
Anesthesia Post Note  Patient: Rhonda Shannon  Procedure(s) Performed: REPEAT CESAREAN SECTION WITH BILATERAL TUBAL LIGATION (Bilateral )     Patient location during evaluation: PACU Anesthesia Type: Combined Spinal/Epidural Level of consciousness: oriented and awake and alert Pain management: pain level controlled Vital Signs Assessment: post-procedure vital signs reviewed and stable Respiratory status: spontaneous breathing, respiratory function stable and patient connected to nasal cannula oxygen Cardiovascular status: blood pressure returned to baseline and stable Postop Assessment: no headache, no backache and no apparent nausea or vomiting Anesthetic complications: no    Last Vitals:  Vitals:   06/03/18 1800 06/03/18 1828  BP: 128/70 131/63  Pulse: 62 63  Resp: 20 17  Temp:  36.6 C  SpO2: 99% 99%    Last Pain:  Vitals:   06/03/18 1828  TempSrc: Oral  PainSc: 1    Pain Goal: Patients Stated Pain Goal: 5 (06/03/18 1711)               Marna Weniger L [name]

## 2018-06-03 NOTE — Transfer of Care (Signed)
Immediate Anesthesia Transfer of Care Note  Patient: Rhonda Shannon  Procedure(s) Performed: REPEAT CESAREAN SECTION WITH BILATERAL TUBAL LIGATION (Bilateral )  Patient Location: PACU  Anesthesia Type:Spinal and Epidural  Level of Consciousness: awake, alert  and oriented  Airway & Oxygen Therapy: Patient Spontanous Breathing  Post-op Assessment: Report given to RN and Post -op Vital signs reviewed and stable  Post vital signs: Reviewed and stable HR 68, RR 16, SaO2 100%, BP 110/66  Last Vitals:  Vitals Value Taken Time  BP    Temp    Pulse    Resp    SpO2      Last Pain:  Vitals:   06/03/18 1305  TempSrc: Oral         Complications: No apparent anesthesia complications

## 2018-06-03 NOTE — Op Note (Signed)
Cesarean Section Procedure Note  Indications: P2 at 4539 3/7wks with h/o c-section desiring sterilization.  Pre-operative Diagnosis: 1.h/o Prior Cesarean Section 2.Desire for Sterilization   Post-operative Diagnosis: 1.h/o Prior Cesarean Section 2.Desire for Sterilization  Procedure: CESAREAN SECTION WITH BILATERAL TUBAL LIGATION/STERILIZATION/FULGURATION  Surgeon: Osborn Cohooberts, Rania Prothero, MD    Assistants: Dale DurhamJade Montana, CNM  Anesthesia: Regional  Procedure Details  The patient was taken to the operating room after the risks, benefits, complications, treatment options, and expected outcomes were discussed with the patient.  The patient concurred with the proposed plan, giving informed consent which was signed and witnessed. The patient was taken to Operating Room One, identified as Rhonda Shannon and the procedure verified as C-Section Delivery. A Time Out was held and the above information confirmed.  After induction of anesthesia by obtaining a surgical level via the spinal, the patient was prepped and draped in the usual sterile manner. A Pfannenstiel skin incision was made and carried down through the subcutaneous tissue to the underlying layer of fascia.  The fascia was incised bilaterally and extended transversely bilaterally with the Mayo scissors. Kocher clamps were placed on the inferior aspect of the fascial incision and the underlying rectus muscle was separated from the fascia. The same was done on the superior aspect of the fascial incision.  The peritoneum was identified, entered bluntly and extended manually. An Alexis self-retaining retractor was placed.  The utero-vesical peritoneal reflection was incised transversely and the bladder flap was bluntly freed from the lower uterine segment. A low transverse uterine incision was made with the scalpel and extended bilaterally with the bandage scissors.  The infant was delivered in vertex position without difficulty. After the umbilical cord  was clamped and cut, the infant was handed to the awaiting pediatricians.  Cord blood was obtained for evaluation.  The placenta was removed intact and appeared to be within normal limits. The uterus was cleared of all clots and debris. The uterine incision was closed with running interlocking sutures of 0 Vicryl and a second imbricating layer was performed as well.   Bilateral tubes and ovaries appeared to be within normal limits.  Good hemostasis was noted.  Copious irrigation was performed until clear.  The left fallopian tube was grasped in the midportion with a babcock after carrying it out to its fimbriated end, clamped beneath the tube, excised and suture ligated with 2-0 vicryl and ligated with 2-0 vicryl.  The pedicle was cauterized with the bovie. The same was done on the contralateral side.  The peritoneum was repaired with 2-0 chromic via a running suture.  The fascia was reapproximated with a running suture of 0 Vicryl.  The skin was reapproximated with a subcuticular suture of 3-0 monocryl.  Steristrips were applied benzoin.  Instrument, sponge, and needle counts were correct prior to abdominal closure and at the conclusion of the case.  The patient was awaiting transfer to the recovery room in good condition.  Findings: Live female infant with Apgars 8 at one minute and 9 at five minutes.  Normal appearing bilateral ovaries and fallopian tubes were noted.  Estimated Blood Loss:  754 ml         Drains: Foley to gravity 250 cc         Total IV Fluids: 1500 ml         Specimens to Pathology: Bilateral Fallopian Tubes         Complications:  None; patient tolerated the procedure well.  Disposition: PACU - hemodynamically stable.         Condition: stable  Attending Attestation: I performed the procedure.

## 2018-06-03 NOTE — Anesthesia Preprocedure Evaluation (Addendum)
Anesthesia Evaluation  Patient identified by MRN, date of birth, ID band Patient awake    Reviewed: Allergy & Precautions, NPO status , Patient's Chart, lab work & pertinent test results  Airway Mallampati: I  TM Distance: >3 FB Neck ROM: Full    Dental no notable dental hx. (+) Teeth Intact, Dental Advisory Given   Pulmonary former smoker,    Pulmonary exam normal breath sounds clear to auscultation       Cardiovascular negative cardio ROS Normal cardiovascular exam Rhythm:Regular Rate:Normal     Neuro/Psych negative neurological ROS  negative psych ROS   GI/Hepatic negative GI ROS, Neg liver ROS,   Endo/Other  negative endocrine ROS  Renal/GU negative Renal ROS  negative genitourinary   Musculoskeletal negative musculoskeletal ROS (+)   Abdominal   Peds  Hematology negative hematology ROS (+) anemia ,   Anesthesia Other Findings   Reproductive/Obstetrics (+) Pregnancy                            Anesthesia Physical Anesthesia Plan  ASA: II  Anesthesia Plan: Combined Spinal and Epidural   Post-op Pain Management:    Induction:   PONV Risk Score and Plan: 2 and Treatment may vary due to age or medical condition  Airway Management Planned: Natural Airway  Additional Equipment:   Intra-op Plan:   Post-operative Plan:   Informed Consent: I have reviewed the patients History and Physical, chart, labs and discussed the procedure including the risks, benefits and alternatives for the proposed anesthesia with the patient or authorized representative who has indicated his/her understanding and acceptance.   Dental advisory given  Plan Discussed with: Anesthesiologist and CRNA  Anesthesia Plan Comments: (Patient identified. Risks, benefits, options discussed with patient including but not limited to bleeding, infection, nerve damage, paralysis, failed block, incomplete pain control,  headache, blood pressure changes, nausea, vomiting, reactions to medication, itching, and post partum back pain. Confirmed with bedside nurse the patient's most recent platelet count. Confirmed with the patient that they are not taking any anticoagulation, have any bleeding history or any family history of bleeding disorders. Patient expressed understanding and wishes to proceed. All questions were answered. )        Anesthesia Quick Evaluation

## 2018-06-03 NOTE — Anesthesia Procedure Notes (Signed)
Combined Spinal Epidural  Patient location during procedure: OR Start time: 06/03/2018 3:10 PM End time: 06/03/2018 3:20 PM Staffing Anesthesiologist: Elmer PickerWoodrum, Jacques Fife L, MD Performed: anesthesiologist  Preanesthetic Checklist Completed: patient identified, surgical consent, pre-op evaluation, timeout performed, IV checked, risks and benefits discussed and monitors and equipment checked Spinal Block Patient position: sitting Prep: DuraPrep Patient monitoring: cardiac monitor, continuous pulse ox and blood pressure Approach: midline Location: L3-4 Injection technique: catheter Needle Needle type: Pencan and Tuohy  Needle gauge: 24 G Needle length: 9 cm Needle insertion depth: 5 cm Catheter type: closed end flexible Catheter at skin depth: 10 cm Assessment Sensory level: T6 Additional Notes Functioning IV was confirmed and monitors were applied. Sterile prep and drape, including hand hygiene and sterile gloves were used. The patient was positioned and the spine was prepped. The skin was anesthetized with lidocaine.  Free flow of clear CSF was obtained prior to injecting local anesthetic into the CSF.  The spinal needle aspirated freely following injection.  The needle was carefully withdrawn.  The patient tolerated the procedure well.

## 2018-06-03 NOTE — Anesthesia Procedure Notes (Deleted)
Anesthesia Procedure Note     

## 2018-06-04 ENCOUNTER — Encounter (HOSPITAL_COMMUNITY): Payer: Self-pay | Admitting: Obstetrics and Gynecology

## 2018-06-04 LAB — CBC
HEMATOCRIT: 29.5 % — AB (ref 36.0–46.0)
Hemoglobin: 9.9 g/dL — ABNORMAL LOW (ref 12.0–15.0)
MCH: 29.8 pg (ref 26.0–34.0)
MCHC: 33.6 g/dL (ref 30.0–36.0)
MCV: 88.9 fL (ref 78.0–100.0)
PLATELETS: 149 10*3/uL — AB (ref 150–400)
RBC: 3.32 MIL/uL — AB (ref 3.87–5.11)
RDW: 13.2 % (ref 11.5–15.5)
WBC: 10.8 10*3/uL — ABNORMAL HIGH (ref 4.0–10.5)

## 2018-06-04 NOTE — Progress Notes (Signed)
MOB was referred for history of depression/anxiety. * Referral screened out by Clinical Social Worker because none of the following criteria appear to apply: ~ History of anxiety/depression during this pregnancy, or of post-partum depression following prior delivery. ~ Diagnosis of anxiety and/or depression within last 3 years OR * MOB's symptoms currently being treated with medication and/or therapy. Please contact the Clinical Social Worker if needs arise, by MOB request, or if MOB scores greater than 9/yes to question 10 on Edinburgh Postpartum Depression Screen.  

## 2018-06-04 NOTE — Lactation Note (Signed)
This note was copied from a baby's chart. Lactation Consultation Note  Patient Name: Rhonda Shannon ZOXWR'UToday's Date: 06/04/2018 Reason for consult: Initial assessment;Term P3, 1315 female infant. LC unable assess latch at this time. Mom has guest in room     Danelle EarthlyRobin Meleana Commerford 06/04/2018, 7:10 AM

## 2018-06-04 NOTE — Lactation Note (Signed)
This note was copied from a baby's chart. Lactation Consultation Note  Patient Name: Rhonda Shannon   Attempted to visit with mom, she and infant were in deep sleep. LC Brochures left in the room, will return at a later time.      Maternal Data    Feeding Feeding Type: Breast Fed Length of feed: 45 min  LATCH Score Latch: Grasps breast easily, tongue down, lips flanged, rhythmical sucking.  Audible Swallowing: A few with stimulation  Type of Nipple: Everted at rest and after stimulation  Comfort (Breast/Nipple): Filling, red/small blisters or bruises, mild/mod discomfort(some pinching; adjusted latch)  Hold (Positioning): Assistance needed to correctly position infant at breast and maintain latch.  LATCH Score: 7  Interventions    Lactation Tools Discussed/Used     Consult Status      Rhonda Shannon Shannon, 12:04 PM

## 2018-06-04 NOTE — Anesthesia Postprocedure Evaluation (Signed)
Anesthesia Post Note  Patient: Milderd MeagerDanielle C Estock  Procedure(s) Performed: REPEAT CESAREAN SECTION WITH BILATERAL TUBAL LIGATION (Bilateral )     Patient location during evaluation: Mother Baby Anesthesia Type: Spinal Level of consciousness: awake, awake and alert and oriented Pain management: pain level controlled Vital Signs Assessment: post-procedure vital signs reviewed and stable Respiratory status: spontaneous breathing, nonlabored ventilation and respiratory function stable Cardiovascular status: stable Postop Assessment: no headache, no backache, patient able to bend at knees, no apparent nausea or vomiting, adequate PO intake and able to ambulate Anesthetic complications: no    Last Vitals:  Vitals:   06/04/18 0330 06/04/18 0730  BP: 110/62 (!) 111/49  Pulse: (!) 56 (!) 51  Resp: 16 16  Temp: 36.7 C 36.5 C  SpO2:  99%    Last Pain:  Vitals:   06/04/18 0730  TempSrc: Oral  PainSc: 0-No pain   Pain Goal: Patients Stated Pain Goal: 5 (06/03/18 1711)               Zalayah Pizzuto

## 2018-06-04 NOTE — Progress Notes (Signed)
Subjective: Postpartum Day 1: Cesarean Delivery repeat LTCS with BTL Patient reports + flatus and no problems voiding.  Ambulating, pain controlled, breastfeeding going well  Objective: Vital signs in last 24 hours: Temp:  [97.5 F (36.4 C)-98.4 F (36.9 C)] 97.7 F (36.5 C) (08/29 0730) Pulse Rate:  [51-74] 51 (08/29 0730) Resp:  [16-22] 16 (08/29 0730) BP: (104-135)/(49-90) 111/49 (08/29 0730) SpO2:  [99 %-100 %] 99 % (08/29 0730) Weight:  [77.3 kg] 77.3 kg (08/28 1305)  Physical Exam:  General: alert, cooperative and no distress Lochia: appropriate Uterine Fundus: firm Incision: small amount old drainage on honeycomb DVT Evaluation: No evidence of DVT seen on physical exam.  Recent Labs    06/02/18 1530 06/04/18 0610  HGB 10.7* 9.9*  HCT 32.6* 29.5*    Assessment/Plan: Status post Cesarean section. Doing well postoperatively.  Continue current care.  Plan discharge tomorrow.  Rhonda Shannon 06/04/2018, 9:32 AM

## 2018-06-05 LAB — BIRTH TISSUE RECOVERY COLLECTION (PLACENTA DONATION)

## 2018-06-05 MED ORDER — OXYCODONE HCL 5 MG PO TABS: 5.0000 mg | ORAL_TABLET | ORAL | 0 refills | Status: AC | PRN

## 2018-06-05 NOTE — Discharge Summary (Signed)
OB Discharge Summary     Patient Name: Rhonda Shannon DOB: [birth year] MRN: [id number]  Date of admission: 06/03/2018 Delivering MD: Osborn CohoOBERTS, ANGELA   Date of discharge: 06/05/2018  Admitting diagnosis: Prior Cesarean Section with desire for sterilization Intrauterine pregnancy: 694w3d     Secondary diagnosis:  Active Problems:   Status post repeat low transverse cesarean section  Additional problems: None     Discharge diagnosis: Term Pregnancy Delivered   Bilateral tubal ligation                                                                                             Post partum procedures:None  Augmentation: N/A  Complications: None  Hospital course:  Sceduled C/S   42 y.o. yo [id number] at 1044w3d was admitted to the hospital 06/03/2018 for scheduled cesarean section with the following indication:Elective Repeat.  Membrane Rupture Time/Date: 3:47 PM ,06/03/2018   Patient delivered a Viable infant.06/03/2018  Details of operation can be found in separate operative note.  Pateint had an uncomplicated postpartum course.  She is ambulating, tolerating a regular diet, passing flatus, and urinating well. Patient is discharged home in stable condition on  06/05/18         Physical exam  Vitals:   06/04/18 1125 06/04/18 1600 06/04/18 2255 06/05/18 0637  BP: 137/64 136/74 125/68 123/68  Pulse: (!) 52 (!) 56 (!) 57 (!) 53  Resp: 18 16  16   Temp: 98.2 F (36.8 C) 98.8 F (37.1 C) 98.8 F (37.1 C) 98.1 F (36.7 C)  TempSrc: Oral Oral Oral Oral  SpO2: 99% 99%    Weight:      Height:       General: alert, cooperative and no distress Lochia: appropriate Uterine Fundus: firm Incision: Dressing is clean, dry, and intact DVT Evaluation: No evidence of DVT seen on physical exam. Labs: Lab Results  Component Value Date   WBC 10.8 (H) 06/04/2018   HGB 9.9 (L) 06/04/2018   HCT 29.5 (L) 06/04/2018   MCV 88.9 06/04/2018   PLT 149 (L) 06/04/2018   CMP Latest Ref Rng & Units  09/12/2016  Glucose 65 - 99 mg/dL 91  BUN 6 - 20 mg/dL 8  Creatinine 0.100.57 - 2.721.00 mg/dL 5.360.62  Sodium 644134 - 034144 mmol/L 139  Potassium 3.5 - 5.2 mmol/L 4.5  Chloride 96 - 106 mmol/L 101  CO2 18 - 29 mmol/L 27  Calcium 8.7 - 10.2 mg/dL 9.5  Total Protein 6.0 - 8.3 g/dL -  Total Bilirubin 0.2 - 1.2 mg/dL -  Alkaline Phos 39 - 742117 U/L -  AST 0 - 37 U/L -  ALT 0 - 35 U/L -    Discharge instruction: per After Visit Summary and "Baby and Me Booklet".  After visit meds:  Allergies as of 06/05/2018   No Known Allergies     Medication List    STOP taking these medications   HEMATINIC/FOLIC ACID 324-1 MG Tabs Generic drug:  Ferrous Fumarate-Folic Acid   oxycodone-acetaminophen 2.5-325 MG tablet Commonly known as:  PERCOCET   vitamin B-12 1000 MCG tablet Commonly known as:  CYANOCOBALAMIN  TAKE these medications   calcium carbonate 500 MG chewable tablet Commonly known as:  TUMS - dosed in mg elemental calcium Chew 1 tablet by mouth daily as needed for indigestion or heartburn.   ibuprofen 600 MG tablet Commonly known as:  ADVIL,MOTRIN Take 1 tablet (600 mg total) by mouth every 6 (six) hours as needed.   oxyCODONE 5 MG immediate release tablet Commonly known as:  Oxy IR/ROXICODONE Take 1 tablet (5 mg total) by mouth every 4 (four) hours as needed (pain scale 4-7).   prenatal multivitamin Tabs tablet Take 1 tablet by mouth daily at 12 noon.       Diet: routine diet  Activity: Advance as tolerated. Pelvic rest for 6 weeks.   Outpatient follow up:6 weeks Follow up Appt:No future appointments. Follow up Visit:No follow-ups on file.  Postpartum contraception: Tubal Ligation  Newborn Data: Live born female  Birth Weight: 7 lb 11.6 oz (3505 g) APGAR: 8, 9  Newborn Delivery   Birth date/time:  06/03/2018 15:48:00 Delivery type:  C-Section, Low Transverse Trial of labor:  No C-section categorization:  Repeat     Baby Feeding: Breast Disposition:home with  mother   06/05/2018 Kenney Houseman, CNM

## 2018-06-07 LAB — TYPE AND SCREEN
ABO/RH(D): A POS
Antibody Screen: NEGATIVE
UNIT DIVISION: 0
Unit division: 0

## 2018-06-07 LAB — BPAM RBC
BLOOD PRODUCT EXPIRATION DATE: 201909242359
BLOOD PRODUCT EXPIRATION DATE: 201909242359
UNIT TYPE AND RH: 6200
Unit Type and Rh: 6200

## 2018-10-05 ENCOUNTER — Encounter: Payer: Self-pay | Admitting: Emergency Medicine

## 2018-10-05 ENCOUNTER — Ambulatory Visit: Payer: BC Managed Care – PPO | Admitting: Emergency Medicine

## 2018-10-05 ENCOUNTER — Other Ambulatory Visit: Payer: Self-pay

## 2018-10-05 VITALS — BP 118/76 | HR 78 | Temp 98.3°F | Resp 16 | Ht 64.0 in | Wt 151.0 lb

## 2018-10-05 DIAGNOSIS — E785 Hyperlipidemia, unspecified: Secondary | ICD-10-CM | POA: Diagnosis not present

## 2018-10-05 DIAGNOSIS — Z23 Encounter for immunization: Secondary | ICD-10-CM | POA: Diagnosis not present

## 2018-10-05 DIAGNOSIS — Z862 Personal history of diseases of the blood and blood-forming organs and certain disorders involving the immune mechanism: Secondary | ICD-10-CM | POA: Diagnosis not present

## 2018-10-05 NOTE — Progress Notes (Signed)
Rhonda Shannon 42 y.o.   Chief Complaint  Patient presents with  . Establish Care  . ELEVATED CHOLESTEROL    per patient OB/GYN suggested seeing Primary provider    HISTORY OF PRESENT ILLNESS: This is a 42 y.o. female here to establish care.  First visit with me.  Recent blood work show mild elevations of cholesterol and LDL cholesterol.  Normal HDL cholesterol normal triglycerides.  Has no history of chronic medical problems.  Non-smoker.  Non-EtOH user.  Family history of dementia.  Recent delivery, newborn at home.  HPI   Prior to Admission medications   Medication Sig Start Date End Date Taking? Authorizing Provider  Cyanocobalamin (VITAMIN B 12 PO) Take 2,500 mg by mouth daily.   Yes [provider]  ibuprofen (ADVIL,MOTRIN) 600 MG tablet Take 1 tablet (600 mg total) by mouth every 6 (six) hours as needed. 07/07/17  Yes Osborn Coho, MD  OVER THE COUNTER MEDICATION    Yes [provider]  calcium carbonate (TUMS - DOSED IN MG ELEMENTAL CALCIUM) 500 MG chewable tablet Chew 1 tablet by mouth daily as needed for indigestion or heartburn.    [provider]  oxyCODONE (OXY IR/ROXICODONE) 5 MG immediate release tablet Take 1 tablet (5 mg total) by mouth every 4 (four) hours as needed (pain scale 4-7). Patient not taking: Reported on 10/05/2018 06/05/18   Prothero, Henderson Newcomer, CNM  Prenatal Vit-Fe Fumarate-FA (PRENATAL MULTIVITAMIN) TABS tablet Take 1 tablet by mouth daily at 12 noon.    [provider]    No Known Allergies  Patient Active Problem List   Diagnosis Date Noted  . Status post repeat low transverse cesarean section 06/03/2018  . 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 Unspecified vitamin D deficiency 05/11/2012  . History of anemia 05/11/2012  . History of anxiety disorder 05/11/2012  . History of tobacco use 05/11/2012    Past Medical History:  Diagnosis Date  . AMA (advanced maternal age) multigravida  35+   . Anemia   . Anxiety   . Depression     Past Surgical History:  Procedure Laterality Date  . CESAREAN SECTION  2000, 2003, 06/03/2018  . CESAREAN SECTION WITH BILATERAL TUBAL LIGATION Bilateral 06/03/2018   Procedure: REPEAT CESAREAN SECTION WITH BILATERAL TUBAL LIGATION;  Surgeon: Osborn Coho, MD;  Location: [facility];  Service: Obstetrics;  Laterality: Bilateral;  . DILATION AND CURETTAGE OF UTERUS    . DILATION AND EVACUATION N/A 07/07/2017   Procedure: DILATATION AND EVACUATION;  Surgeon: Osborn Coho, MD;  Location: [facility] ORS;  Service: Gynecology;  Laterality: N/A;  . THERAPEUTIC ABORTION    . WISDOM TOOTH EXTRACTION      Social History   Socioeconomic History  . Marital status: Married    Spouse name: Not on file  . Number of children: Not on file  . Years of education: Not on file  . Highest education level: Not on file  Occupational History  . Occupation: Runner, broadcasting/film/video  Social Needs  . Financial resource strain: Not hard at all  . Food insecurity:    Worry: Never true    Inability: Never true  . Transportation needs:    Medical: No    Non-medical: No  Tobacco Use  . Smoking status: Former Smoker    Packs/day: 1.00    Years: 16.00    Pack years: 16.00    Types: Cigarettes    Last attempt to quit: 10/07/2010    Years since quitting:  8.0  . Smokeless tobacco: Never Used  Substance and Sexual Activity  . Alcohol use: Yes    Alcohol/week: 4.0 standard drinks    Types: 4 Standard drinks or equivalent per week    Comment: beer and wine 2x/wk  . Drug use: No  . Sexual activity: Yes  Lifestyle  . Physical activity:    Days per week: 0 days    Minutes per session: Not on file  . Stress: Not at all  Relationships  . Social connections:    Talks on phone: Not on file    Gets together: Not on file    Attends religious service: Not on file    Active member of club or organization: Not on file    Attends meetings of clubs or organizations: Not on file      Relationship status: Not on file  . Intimate partner violence:    Fear of current or ex partner: No    Emotionally abused: No    Physically abused: No    Forced sexual activity: No  Other Topics Concern  . Not on file  Social History Narrative   Exercise-walking 3x/week for 30 minutes. Single. Education: [employer]. Exercise: Yes.   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 Hypertension Mother        age dx in her 550's  . Hyperlipidemia Mother   . Stroke Maternal Grandmother   . Dementia Maternal Grandmother   . Hypertension Maternal Grandmother   . Aneurysm Maternal Grandfather   . Heart disease Maternal Grandfather   . Cancer Father        prostate  . Hypertension Father   . Diabetes Paternal Grandmother   . Stroke Paternal Grandmother      Review of Systems  Constitutional: Negative.  Negative for chills and fever.  HENT: Negative.  Negative for congestion, hearing loss, nosebleeds and sore throat.   Eyes: Negative.  Negative for blurred vision and double vision.  Respiratory: Negative.  Negative for cough and shortness of breath.   Cardiovascular: Negative.  Negative for chest pain and palpitations.  Gastrointestinal: Negative.  Negative for abdominal pain, blood in stool, diarrhea, melena, nausea and vomiting.  Genitourinary: Negative.  Negative for dysuria and hematuria.  Musculoskeletal: Negative.  Negative for back pain, myalgias and neck pain.  Skin: Negative.  Negative for rash.  Neurological: Negative.  Negative for dizziness and headaches.  Endo/Heme/Allergies: Negative.   All other systems reviewed and are negative.   Vitals:   10/05/18 1021  BP: 118/76  Pulse: 78  Resp: 16  Temp: 98.3 F (36.8 C)  SpO2: 99%    Physical Exam Vitals signs reviewed.  Constitutional:      Appearance: Normal appearance. She is normal weight.  HENT:     Head: Normocephalic and atraumatic.     Mouth/Throat:     Mouth:  Mucous membranes are moist.     Pharynx: Oropharynx is clear.  Eyes:     Extraocular Movements: Extraocular movements intact.     Conjunctiva/sclera: Conjunctivae normal.     Pupils: Pupils are equal, round, and reactive to light.  Neck:     Musculoskeletal: Normal range of motion and neck supple.  Cardiovascular:     Rate and Rhythm: Normal rate and regular rhythm.     Pulses: Normal pulses.     Heart sounds: Normal heart sounds.  Pulmonary:     Effort: Pulmonary effort  is normal.     Breath sounds: Normal breath sounds.  Musculoskeletal: Normal range of motion.  Skin:    General: Skin is warm and dry.     Capillary Refill: Capillary refill takes less than 2 seconds.  Neurological:     General: No focal deficit present.     Mental Status: She is alert and oriented to person, place, and time.     Motor: No weakness.     Coordination: Coordination normal.  Psychiatric:        Mood and Affect: Mood normal.        Behavior: Behavior normal.    A total of 25 minutes was spent in the room with the patient, greater than 50% of which was in counseling/coordination of care regarding high cholesterol, nutrition, diet and exercise, medication, need for repeat blood work, and need for follow-up..   ASSESSMENT & PLAN: Rhonda Shannon was seen today for establish care and elevated cholesterol.  Diagnoses and all orders for this visit:  Dyslipidemia -     Comprehensive metabolic panel -     TSH -     Lipid panel -     Hemoglobin A1c -     CBC with Differential/Platelet  History of anemia -     CBC with Differential/Platelet  Need for prophylactic vaccination and inoculation against influenza -     Flu Vaccine QUAD 36+ mos IM    Patient Instructions       If you have lab work done today you will be contacted with your lab results within the next 2 weeks.  If you have not heard from [place] then please contact us. The fastest way to get your results is to register for My Chart.   IF  you received an x-ray today, you will receive an invoice from [facility]. Please contact [facility] at [phone] with questions or concerns regarding your invoice.   IF you received labwork today, you will receive an invoice from [facility]. Please contact [facility] at [phone] with questions or concerns regarding your invoice.   Our billing staff will not be able to assist you with questions regarding bills from these companies.  You will be contacted with the lab results as soon as they are available. The fastest way to get your results is to activate your My Chart account. Instructions are located on the last page of this paperwork. If you have not heard from [place] regarding the results in 2 weeks, please contact this office.     High Cholesterol  High cholesterol is a condition in which the blood has high levels of a white, waxy, fat-like substance (cholesterol). The human body needs small amounts of cholesterol. The liver makes all the cholesterol that the body needs. Extra (excess) cholesterol comes from the food that we eat. Cholesterol is carried from the liver by the blood through the blood vessels. If you have high cholesterol, deposits (plaques) may build up on the walls of your blood vessels (arteries). Plaques make the arteries narrower and stiffer. Cholesterol plaques increase your risk for heart attack and stroke. Work with your health care provider to keep your cholesterol levels in a healthy range. What increases the risk? This condition is more likely to develop in people who:  Eat foods that are high in animal fat (saturated fat) or cholesterol.  Are overweight.  Are not getting enough exercise.  Have a family history of high cholesterol. What are the signs or symptoms? There are no symptoms  of this condition. How is this diagnosed? This condition may be diagnosed from the results of a blood test.  If you are older than age 41, your health care  provider may check your cholesterol every 4-6 years.  You may be checked more often if you already have high cholesterol or other risk factors for heart disease. The blood test for cholesterol measures:  "Bad" cholesterol (LDL cholesterol). This is the main type of cholesterol that causes heart disease. The desired level for LDL is less than 100.  "Good" cholesterol (HDL cholesterol). This type helps to protect against heart disease by cleaning the arteries and carrying the LDL away. The desired level for HDL is 60 or higher.  Triglycerides. These are fats that the body can store or burn for energy. The desired number for triglycerides is lower than 150.  Total cholesterol. This is a measure of the total amount of cholesterol in your blood, including LDL cholesterol, HDL cholesterol, and triglycerides. A healthy number is less than 200. How is this treated? This condition is treated with diet changes, lifestyle changes, and medicines. Diet changes  This may include eating more whole grains, fruits, vegetables, nuts, and fish.  This may also include cutting back on red meat and foods that have a lot of added sugar. Lifestyle changes  Changes may include getting at least 40 minutes of aerobic exercise 3 times a week. Aerobic exercises include walking, biking, and swimming. Aerobic exercise along with a healthy diet can help you maintain a healthy weight.  Changes may also include quitting smoking. Medicines  Medicines are usually given if diet and lifestyle changes have failed to reduce your cholesterol to healthy levels.  Your health care provider may prescribe a statin medicine. Statin medicines have been shown to reduce cholesterol, which can reduce the risk of heart disease. Follow these instructions at home: Eating and drinking If told by your health care provider:  Eat chicken (without skin), fish, veal, shellfish, ground [place] breast, and round or loin cuts of red meat.  Do  not eat fried foods or fatty meats, such as hot dogs and salami.  Eat plenty of fruits, such as apples.  Eat plenty of vegetables, such as broccoli, potatoes, and carrots.  Eat beans, peas, and lentils.  Eat grains such as barley, rice, couscous, and bulgur wheat.  Eat pasta without cream sauces.  Use skim or nonfat milk, and eat low-fat or nonfat yogurt and cheeses.  Do not eat or drink whole milk, cream, ice cream, egg yolks, or hard cheeses.  Do not eat stick margarine or tub margarines that contain trans fats (also called partially hydrogenated oils).  Do not eat saturated tropical oils, such as coconut oil and palm oil.  Do not eat cakes, cookies, crackers, or other baked goods that contain trans fats.  General instructions  Exercise as directed by your health care provider. Increase your activity level with activities such as gardening, walking, and taking the stairs.  Take over-the-counter and prescription medicines only as told by your health care provider.  Do not use any products that contain nicotine or tobacco, such as cigarettes and e-cigarettes. If you need help quitting, ask your health care provider.  Keep all follow-up visits as told by your health care provider. This is important. Contact a health care provider if:  You are struggling to maintain a healthy diet or weight.  You need help to start on an exercise program.  You need help to stop smoking. Get  help right away if:  You have chest pain.  You have trouble breathing. This information is not intended to replace advice given to you by your health care provider. Make sure you discuss any questions you have with your health care provider. Document Released: 09/23/2005 Document Revised: 04/20/2016 Document Reviewed: 03/23/2016 Elsevier Interactive Patient Education  2019 Elsevier Inc.      Edwina Barth, MD [facility]

## 2018-10-05 NOTE — Patient Instructions (Addendum)
If you have lab work done today you will be contacted with your lab results within the next 2 weeks.  If you have not heard from [place] then please contact us. The fastest way to get your results is to register for My Chart.   IF you received an x-ray today, you will receive an invoice from [facility]. Please contact [facility] at [phone] with questions or concerns regarding your invoice.   IF you received labwork today, you will receive an invoice from [place]. Please contact [facility] at [phone] with questions or concerns regarding your invoice.   Our billing staff will not be able to assist you with questions regarding bills from these companies.  You will be contacted with the lab results as soon as they are available. The fastest way to get your results is to activate your My Chart account. Instructions are located on the last page of this paperwork. If you have not heard from [place] regarding the results in 2 weeks, please contact this office.     High Cholesterol  High cholesterol is a condition in which the blood has high levels of a white, waxy, fat-like substance (cholesterol). The human body needs small amounts of cholesterol. The liver makes all the cholesterol that the body needs. Extra (excess) cholesterol comes from the food that we eat. Cholesterol is carried from the liver by the blood through the blood vessels. If you have high cholesterol, deposits (plaques) may build up on the walls of your blood vessels (arteries). Plaques make the arteries narrower and stiffer. Cholesterol plaques increase your risk for heart attack and stroke. Work with your health care provider to keep your cholesterol levels in a healthy range. What increases the risk? This condition is more likely to develop in people who:  Eat foods that are high in animal fat (saturated fat) or cholesterol.  Are overweight.  Are not getting enough exercise.  Have a family history of  high cholesterol. What are the signs or symptoms? There are no symptoms of this condition. How is this diagnosed? This condition may be diagnosed from the results of a blood test.  If you are older than age 54, your health care provider may check your cholesterol every 4-6 years.  You may be checked more often if you already have high cholesterol or other risk factors for heart disease. The blood test for cholesterol measures:  "Bad" cholesterol (LDL cholesterol). This is the main type of cholesterol that causes heart disease. The desired level for LDL is less than 100.  "Good" cholesterol (HDL cholesterol). This type helps to protect against heart disease by cleaning the arteries and carrying the LDL away. The desired level for HDL is 60 or higher.  Triglycerides. These are fats that the body can store or burn for energy. The desired number for triglycerides is lower than 150.  Total cholesterol. This is a measure of the total amount of cholesterol in your blood, including LDL cholesterol, HDL cholesterol, and triglycerides. A healthy number is less than 200. How is this treated? This condition is treated with diet changes, lifestyle changes, and medicines. Diet changes  This may include eating more whole grains, fruits, vegetables, nuts, and fish.  This may also include cutting back on red meat and foods that have a lot of added sugar. Lifestyle changes  Changes may include getting at least 40 minutes of aerobic exercise 3 times a week. Aerobic exercises include walking, biking, and swimming. Aerobic exercise along  along with a healthy diet can help you maintain a healthy weight.  Changes may also include quitting smoking. Medicines  Medicines are usually given if diet and lifestyle changes have failed to reduce your cholesterol to healthy levels.  Your health care provider may prescribe a statin medicine. Statin medicines have been shown to reduce cholesterol, which can reduce the  risk of heart disease. Follow these instructions at home: Eating and drinking If told by your health care provider:  Eat chicken (without skin), fish, veal, shellfish, ground turkey breast, and round or loin cuts of red meat.  Do not eat fried foods or fatty meats, such as hot dogs and salami.  Eat plenty of fruits, such as apples.  Eat plenty of vegetables, such as broccoli, potatoes, and carrots.  Eat beans, peas, and lentils.  Eat grains such as barley, rice, couscous, and bulgur wheat.  Eat pasta without cream sauces.  Use skim or nonfat milk, and eat low-fat or nonfat yogurt and cheeses.  Do not eat or drink whole milk, cream, ice cream, egg yolks, or hard cheeses.  Do not eat stick margarine or tub margarines that contain trans fats (also called partially hydrogenated oils).  Do not eat saturated tropical oils, such as coconut oil and palm oil.  Do not eat cakes, cookies, crackers, or other baked goods that contain trans fats.  General instructions  Exercise as directed by your health care provider. Increase your activity level with activities such as gardening, walking, and taking the stairs.  Take over-the-counter and prescription medicines only as told by your health care provider.  Do not use any products that contain nicotine or tobacco, such as cigarettes and e-cigarettes. If you need help quitting, ask your health care provider.  Keep all follow-up visits as told by your health care provider. This is important. Contact a health care provider if:  You are struggling to maintain a healthy diet or weight.  You need help to start on an exercise program.  You need help to stop smoking. Get help right away if:  You have chest pain.  You have trouble breathing. This information is not intended to replace advice given to you by your health care provider. Make sure you discuss any questions you have with your health care provider. Document Released: 09/23/2005  Document Revised: 04/20/2016 Document Reviewed: 03/23/2016 Elsevier Interactive Patient Education  2019 [employer] Inc.  

## 2018-10-06 ENCOUNTER — Encounter: Payer: Self-pay | Admitting: *Deleted

## 2018-10-06 LAB — LIPID PANEL
Chol/HDL Ratio: 3.3 ratio (ref 0.0–4.4)
Cholesterol, Total: 202 mg/dL — ABNORMAL HIGH (ref 100–199)
HDL: 62 mg/dL (ref >39)
LDL Calculated: 124 mg/dL — ABNORMAL HIGH (ref 0–99)
TRIGLYCERIDES: 80 mg/dL (ref 0–149)
VLDL Cholesterol Cal: 16 mg/dL (ref 5–40)

## 2018-10-06 LAB — COMPREHENSIVE METABOLIC PANEL
ALT: 13 IU/L (ref 0–32)
AST: 12 IU/L (ref 0–40)
Albumin/Globulin Ratio: 1.7 (ref 1.2–2.2)
Albumin: 4.5 g/dL (ref 3.5–5.5)
Alkaline Phosphatase: 79 IU/L (ref 39–117)
BILIRUBIN TOTAL: 0.3 mg/dL (ref 0.0–1.2)
BUN/Creatinine Ratio: 13 (ref 9–23)
BUN: 8 mg/dL (ref 6–24)
CALCIUM: 9.6 mg/dL (ref 8.7–10.2)
CO2: 21 mmol/L (ref 20–29)
Chloride: 103 mmol/L (ref 96–106)
Creatinine, Ser: 0.63 mg/dL (ref 0.57–1.00)
GFR calc non Af Amer: 112 mL/min/{1.73_m2} (ref >59)
GFR, EST AFRICAN AMERICAN: 129 mL/min/{1.73_m2} (ref >59)
Globulin, Total: 2.6 g/dL (ref 1.5–4.5)
Glucose: 93 mg/dL (ref 65–99)
Potassium: 4.3 mmol/L (ref 3.5–5.2)
Sodium: 137 mmol/L (ref 134–144)
TOTAL PROTEIN: 7.1 g/dL (ref 6.0–8.5)

## 2018-10-06 LAB — CBC WITH DIFFERENTIAL/PLATELET
BASOS: 0 %
Basophils Absolute: 0 10*3/uL (ref 0.0–0.2)
EOS (ABSOLUTE): 0.1 10*3/uL (ref 0.0–0.4)
EOS: 2 %
Hematocrit: 36.4 % (ref 34.0–46.6)
Hemoglobin: 11.9 g/dL (ref 11.1–15.9)
Immature Grans (Abs): 0 10*3/uL (ref 0.0–0.1)
Immature Granulocytes: 0 %
Lymphocytes Absolute: 1.7 10*3/uL (ref 0.7–3.1)
Lymphs: 35 %
MCH: 28.7 pg (ref 26.6–33.0)
MCHC: 32.7 g/dL (ref 31.5–35.7)
MCV: 88 fL (ref 79–97)
Monocytes Absolute: 0.3 10*3/uL (ref 0.1–0.9)
Monocytes: 7 %
Neutrophils Absolute: 2.7 10*3/uL (ref 1.4–7.0)
Neutrophils: 56 %
Platelets: 293 10*3/uL (ref 150–450)
RBC: 4.15 x10E6/uL (ref 3.77–5.28)
RDW: 13 % (ref 12.3–15.4)
WBC: 4.9 10*3/uL (ref 3.4–10.8)

## 2018-10-06 LAB — TSH: TSH: 0.945 u[IU]/mL (ref 0.450–4.500)

## 2018-10-06 LAB — HEMOGLOBIN A1C
Est. average glucose Bld gHb Est-mCnc: 100 mg/dL
Hgb A1c MFr Bld: 5.1 % (ref 4.8–5.6)

## 2019-06-22 ENCOUNTER — Ambulatory Visit: Payer: BC Managed Care – PPO | Admitting: Emergency Medicine

## 2019-06-22 ENCOUNTER — Other Ambulatory Visit: Payer: Self-pay

## 2019-06-22 ENCOUNTER — Encounter: Payer: Self-pay | Admitting: Emergency Medicine

## 2019-06-22 VITALS — BP 138/89 | HR 98 | Temp 99.4°F | Resp 16 | Ht 64.0 in | Wt 156.4 lb

## 2019-06-22 DIAGNOSIS — K429 Umbilical hernia without obstruction or gangrene: Secondary | ICD-10-CM

## 2019-06-22 DIAGNOSIS — R109 Unspecified abdominal pain: Secondary | ICD-10-CM | POA: Diagnosis not present

## 2019-06-22 DIAGNOSIS — F418 Other specified anxiety disorders: Secondary | ICD-10-CM | POA: Diagnosis not present

## 2019-06-22 LAB — POCT URINALYSIS DIP (MANUAL ENTRY)
Bilirubin, UA: NEGATIVE
Blood, UA: NEGATIVE
Glucose, UA: NEGATIVE mg/dL
Ketones, POC UA: NEGATIVE mg/dL
Leukocytes, UA: NEGATIVE
Nitrite, UA: NEGATIVE
Protein Ur, POC: NEGATIVE mg/dL
Spec Grav, UA: >=1.03 — AB (ref 1.010–1.025)
Urobilinogen, UA: 0.2 U/dL
pH, UA: 5 (ref 5.0–8.0)

## 2019-06-22 LAB — POCT URINE PREGNANCY: Preg Test, Ur: NEGATIVE

## 2019-06-22 MED ORDER — ALPRAZOLAM 0.5 MG PO TABS: 0.5000 mg | ORAL_TABLET | Freq: Every day | ORAL | 1 refills | Status: AC | PRN

## 2019-06-22 NOTE — Patient Instructions (Addendum)
If you have lab work done today you will be contacted with your lab results within the next 2 weeks.  If you have not heard from [place] then please contact us. The fastest way to get your results is to register for My Chart.   IF you received an x-ray today, you will receive an invoice from [facility]. Please contact [facility] at [phone] with questions or concerns regarding your invoice.   IF you received labwork today, you will receive an invoice from [facility]. Please contact [facility] at [phone] with questions or concerns regarding your invoice.   Our billing staff will not be able to assist you with questions regarding bills from these companies.  You will be contacted with the lab results as soon as they are available. The fastest way to get your results is to activate your My Chart account. Instructions are located on the last page of this paperwork. If you have not heard from [place] regarding the results in 2 weeks, please contact this office.     Hernia, Adult     A hernia happens when tissue inside your body pushes out through a weak spot in your belly muscles (abdominal wall). This makes a round lump (bulge). The lump may be:  In a scar from surgery that was done in your belly (incisional hernia).  Near your belly button (umbilical hernia).  In your groin (inguinal hernia). Your groin is the area where your leg meets your lower belly (abdomen). This kind of hernia could also be: ? In your scrotum, if you are female. ? In folds of skin around your vagina, if you are female.  In your upper thigh (femoral hernia).  Inside your belly (hiatal hernia). This happens when your stomach slides above the muscle between your belly and your chest (diaphragm). If your hernia is small and it does not cause pain, you may not need treatment. If your hernia is large or it causes pain, you may need surgery. Follow these instructions at home: Activity  Avoid  stretching or overusing (straining) the muscles near your hernia. Straining can happen when you: ? Lift something heavy. ? Poop (have a bowel movement).  Do not lift anything that is heavier than 10 lb (4.5 kg), or the limit that you are told, until your doctor says that it is safe.  Use the strength of your legs when you lift something heavy. Do not use only your back muscles to lift. General instructions  Do these things if told by your doctor so you do not have trouble pooping (constipation): ? Drink enough fluid to keep your pee (urine) pale yellow. ? Eat foods that are high in fiber. These include fresh fruits and vegetables, whole grains, and beans. ? Limit foods that are high in fat and processed sugars. These include foods that are fried or sweet. ? Take medicine for trouble pooping.  When you cough, try to cough gently.  You may try to push your hernia in by very gently pressing on it when you are lying down. Do not try to force the bulge back in if it will not push in easily.  If you are overweight, work with your doctor to lose weight safely.  Do not use any products that have nicotine or tobacco in them. These include cigarettes and e-cigarettes. If you need help quitting, ask your doctor.  If you will be having surgery (hernia repair), watch your hernia for changes in shape, size, or color.  Tell your doctor if you see any changes.  Take over-the-counter and prescription medicines only as told by your doctor.  Keep all follow-up visits as told by your doctor. Contact a doctor if:  You get new pain, swelling, or redness near your hernia.  You poop fewer times in a week than normal.  You have trouble pooping.  You have poop (stool) that is more dry than normal.  You have poop that is harder or larger than normal. Get help right away if:  You have a fever.  You have belly pain that gets worse.  You feel sick to your stomach (nauseous).  You throw up  (vomit).  Your hernia cannot be pushed in by very gently pressing on it when you are lying down. Do not try to force the bulge back in if it will not push in easily.  Your hernia: ? Changes in shape or size. ? Changes color. ? Feels hard or it hurts when you touch it. These symptoms may represent a serious problem that is an emergency. Do not wait to see if the symptoms will go away. Get medical help right away. Call your local emergency services (911 in the [place] Summary  A hernia happens when tissue inside your body pushes out through a weak spot in the belly muscles. This creates a bulge.  If your hernia is small and it does not hurt, you may not need treatment. If your hernia is large or it hurts, you may need surgery.  If you will be having surgery, watch your hernia for changes in shape, size, or color. Tell your doctor about any changes. This information is not intended to replace advice given to you by your health care provider. Make sure you discuss any questions you have with your health care provider. Document Released: 03/13/2010 Document Revised: 01/14/2019 Document Reviewed: 06/25/2017 Elsevier Patient Education  2020 [employer].

## 2019-06-22 NOTE — Progress Notes (Signed)
Rhonda Shannon 43 y.o.   No chief complaint on file. Abdominal mass  HISTORY OF PRESENT ILLNESS: This is a 43 y.o. female complaining of mid abdominal mass for the past [redacted] weeks along with gas/bloating.  Denies abdominal pain.  Mostly umbilical and worse when standing up.  Able to eat and drink.  Denies nausea or vomiting.  Denies fever or chills.  Able to move bowels okay. Also complaining of increased anxiety level.  Has been seeing a therapist.  May need medication. No other complaints or medical concerns today.  HPI   Prior to Admission medications   Medication Sig Start Date End Date Taking? Authorizing Provider  calcium carbonate (TUMS - DOSED IN MG ELEMENTAL CALCIUM) 500 MG chewable tablet Chew 1 tablet by mouth daily as needed for indigestion or heartburn.    [provider]  Cyanocobalamin (VITAMIN B 12 PO) Take 2,500 mg by mouth daily.    [provider]  ibuprofen (ADVIL,MOTRIN) 600 MG tablet Take 1 tablet (600 mg total) by mouth every 6 (six) hours as needed. 07/07/17   Osborn Coho, MD  OVER THE COUNTER MEDICATION     [provider]  oxyCODONE (OXY IR/ROXICODONE) 5 MG immediate release tablet Take 1 tablet (5 mg total) by mouth every 4 (four) hours as needed (pain scale 4-7). Patient not taking: Reported on 10/05/2018 06/05/18   Prothero, Henderson Newcomer, CNM  Prenatal Vit-Fe Fumarate-FA (PRENATAL MULTIVITAMIN) TABS tablet Take 1 tablet by mouth daily at 12 noon.    [provider]    No Known Allergies  Patient Active Problem List   Diagnosis Date Noted  . Dyslipidemia 10/05/2018  . Status post repeat low transverse cesarean section 06/03/2018  . 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 Unspecified vitamin D deficiency 05/11/2012  . History of anemia 05/11/2012  . History of anxiety disorder 05/11/2012  . History of tobacco use 05/11/2012    Past Medical History:  Diagnosis Date  . AMA (advanced maternal age)  multigravida 35+   . Anemia   . Anxiety   . Depression     Past Surgical History:  Procedure Laterality Date  . CESAREAN SECTION  2000, 2003, 06/03/2018  . CESAREAN SECTION WITH BILATERAL TUBAL LIGATION Bilateral 06/03/2018   Procedure: REPEAT CESAREAN SECTION WITH BILATERAL TUBAL LIGATION;  Surgeon: Osborn Coho, MD;  Location: [facility];  Service: Obstetrics;  Laterality: Bilateral;  . DILATION AND CURETTAGE OF UTERUS    . DILATION AND EVACUATION N/A 07/07/2017   Procedure: DILATATION AND EVACUATION;  Surgeon: Osborn Coho, MD;  Location: [facility];  Service: Gynecology;  Laterality: N/A;  . THERAPEUTIC ABORTION    . WISDOM TOOTH EXTRACTION      Social History   Socioeconomic History  . Marital status: Married    Spouse name: Not on file  . Number of children: Not on file  . Years of education: Not on file  . Highest education level: Not on file  Occupational History  . Occupation: Runner, broadcasting/film/video  Social Needs  . Financial resource strain: Not hard at all  . Food insecurity    Worry: Never true    Inability: Never true  . Transportation needs    Medical: No    Non-medical: No  Tobacco Use  . Smoking status: Former Smoker    Packs/day: 1.00    Years: 16.00    Pack years: 16.00    Types: Cigarettes    Quit date: 10/07/2010    Years  since quitting: 8.7  . Smokeless tobacco: Never Used  Substance and Sexual Activity  . Alcohol use: Yes    Alcohol/week: 4.0 standard drinks    Types: 4 Standard drinks or equivalent per week    Comment: beer and wine 2x/wk  . Drug use: No  . Sexual activity: Yes  Lifestyle  . Physical activity    Days per week: 0 days    Minutes per session: Not on file  . Stress: Not at all  Relationships  . Social Herbalist on phone: Not on file    Gets together: Not on file    Attends religious service: Not on file    Active member of club or organization: Not on file    Attends meetings of clubs or organizations: Not on file     Relationship status: Not on file  . Intimate partner violence    Fear of current or ex partner: No    Emotionally abused: No    Physically abused: No    Forced sexual activity: No  Other Topics Concern  . Not on file  Social History Narrative   Exercise-walking 3x/week for 30 minutes. Single. Education: [employer]. Exercise: Yes.   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 Hypertension Mother        age dx in her 41's  . Hyperlipidemia Mother   . Stroke Maternal Grandmother   . Dementia Maternal Grandmother   . Hypertension Maternal Grandmother   . Aneurysm Maternal Grandfather   . Heart disease Maternal Grandfather   . Cancer Father        prostate  . Hypertension Father   . Diabetes Paternal Grandmother   . Stroke Paternal Grandmother      Review of Systems  Constitutional: Negative.  Negative for chills and fever.  HENT: Negative.  Negative for congestion and sore throat.   Eyes: Negative.   Respiratory: Negative.  Negative for cough and shortness of breath.   Cardiovascular: Negative.  Negative for chest pain and palpitations.  Gastrointestinal: Positive for abdominal pain. Negative for blood in stool, diarrhea, melena, nausea and vomiting.       Gas and bloating  Genitourinary: Negative.  Negative for dysuria and hematuria.  Musculoskeletal: Negative.   Skin: Negative.  Negative for rash.  Neurological: Negative.  Negative for dizziness and headaches.  Psychiatric/Behavioral: The patient is nervous/anxious.   All other systems reviewed and are negative.  Vitals:   06/22/19 1404  BP: 138/89  Pulse: 98  Resp: 16  Temp: 99.4 F (37.4 C)  SpO2: 98%     Physical Exam Vitals signs reviewed.  Constitutional:      Appearance: Normal appearance.  HENT:     Head: Normocephalic.  Eyes:     Extraocular Movements: Extraocular movements intact.     Pupils: Pupils are equal, round, and reactive to light.  Neck:      Musculoskeletal: Normal range of motion and neck supple.  Cardiovascular:     Rate and Rhythm: Normal rate and regular rhythm.     Pulses: Normal pulses.     Heart sounds: Normal heart sounds.  Pulmonary:     Effort: Pulmonary effort is normal.     Breath sounds: Normal breath sounds.  Abdominal:     Palpations: Abdomen is soft.     Tenderness: There is no abdominal tenderness.     Hernia: A hernia (Umbilical/ventral) is  present.  Musculoskeletal: Normal range of motion.  Skin:    General: Skin is warm and dry.     Capillary Refill: Capillary refill takes less than 2 seconds.  Neurological:     General: No focal deficit present.     Mental Status: She is alert and oriented to person, place, and time.  Psychiatric:        Mood and Affect: Mood normal.        Behavior: Behavior normal.      ASSESSMENT & PLAN: Duwayne HeckDanielle was seen today for mass.  Diagnoses and all orders for this visit:  Abdominal pain, unspecified abdominal location -     POCT Microscopic Urinalysis ([facility]) -     POCT urinalysis dipstick -     POCT urine pregnancy  Umbilical hernia without obstruction and without gangrene -     US Abdomen Complete; Future  Situational anxiety -     ALPRAZolam (XANAX) 0.5 MG tablet; Take 1 tablet (0.5 mg total) by mouth daily as needed for anxiety.    Patient Instructions       If you have lab work done today you will be contacted with your lab results within the next 2 weeks.  If you have not heard from us then please contact us. The fastest way to get your results is to register for My Chart.   IF you received an x-ray today, you will receive an invoice from [facility]. Please contact [facility] at [phone] with questions or concerns regarding your invoice.   IF you received labwork today, you will receive an invoice from [facility]. Please contact [facility] at [phone] with questions or concerns regarding your invoice.   Our billing staff  will not be able to assist you with questions regarding bills from these companies.  You will be contacted with the lab results as soon as they are available. The fastest way to get your results is to activate your My Chart account. Instructions are located on the last page of this paperwork. If you have not heard from us regarding the results in 2 weeks, please contact this office.     Hernia, Adult     A hernia happens when tissue inside your body pushes out through a weak spot in your belly muscles (abdominal wall). This makes a round lump (bulge). The lump may be:  In a scar from surgery that was done in your belly (incisional hernia).  Near your belly button (umbilical hernia).  In your groin (inguinal hernia). Your groin is the area where your leg meets your lower belly (abdomen). This kind of hernia could also be: ? In your scrotum, if you are female. ? In folds of skin around your vagina, if you are female.  In your upper thigh (femoral hernia).  Inside your belly (hiatal hernia). This happens when your stomach slides above the muscle between your belly and your chest (diaphragm). If your hernia is small and it does not cause pain, you may not need treatment. If your hernia is large or it causes pain, you may need surgery. Follow these instructions at home: Activity  Avoid stretching or overusing (straining) the muscles near your hernia. Straining can happen when you: ? Lift something heavy. ? Poop (have a bowel movement).  Do not lift anything that is heavier than 10 lb (4.5 kg), or the limit that you are told, until your doctor says that it is safe.  Use the strength of your legs when you lift  something heavy. Do not use only your back muscles to lift. General instructions  Do these things if told by your doctor so you do not have trouble pooping (constipation): ? Drink enough fluid to keep your pee (urine) pale yellow. ? Eat foods that are high in fiber. These include  fresh fruits and vegetables, whole grains, and beans. ? Limit foods that are high in fat and processed sugars. These include foods that are fried or sweet. ? Take medicine for trouble pooping.  When you cough, try to cough gently.  You may try to push your hernia in by very gently pressing on it when you are lying down. Do not try to force the bulge back in if it will not push in easily.  If you are overweight, work with your doctor to lose weight safely.  Do not use any products that have nicotine or tobacco in them. These include cigarettes and e-cigarettes. If you need help quitting, ask your doctor.  If you will be having surgery (hernia repair), watch your hernia for changes in shape, size, or color. Tell your doctor if you see any changes.  Take over-the-counter and prescription medicines only as told by your doctor.  Keep all follow-up visits as told by your doctor. Contact a doctor if:  You get new pain, swelling, or redness near your hernia.  You poop fewer times in a week than normal.  You have trouble pooping.  You have poop (stool) that is more dry than normal.  You have poop that is harder or larger than normal. Get help right away if:  You have a fever.  You have belly pain that gets worse.  You feel sick to your stomach (nauseous).  You throw up (vomit).  Your hernia cannot be pushed in by very gently pressing on it when you are lying down. Do not try to force the bulge back in if it will not push in easily.  Your hernia: ? Changes in shape or size. ? Changes color. ? Feels hard or it hurts when you touch it. These symptoms may represent a serious problem that is an emergency. Do not wait to see if the symptoms will go away. Get medical help right away. Call your local emergency services (911 in the [place] Summary  A hernia happens when tissue inside your body pushes out through a weak spot in the belly muscles. This creates a bulge.  If your hernia is  small and it does not hurt, you may not need treatment. If your hernia is large or it hurts, you may need surgery.  If you will be having surgery, watch your hernia for changes in shape, size, or color. Tell your doctor about any changes. This information is not intended to replace advice given to you by your health care provider. Make sure you discuss any questions you have with your health care provider. Document Released: 03/13/2010 Document Revised: 01/14/2019 Document Reviewed: 06/25/2017 Elsevier Patient Education  2020 Elsevier Inc[employer]      Edwina BarthMiguel Alonso Gapinski, MD [facility]

## 2019-06-29 ENCOUNTER — Other Ambulatory Visit: Payer: Self-pay | Admitting: Emergency Medicine

## 2019-06-29 ENCOUNTER — Encounter (HOSPITAL_COMMUNITY): Payer: Self-pay

## 2019-06-29 ENCOUNTER — Other Ambulatory Visit: Payer: Self-pay

## 2019-06-29 ENCOUNTER — Ambulatory Visit (HOSPITAL_COMMUNITY)
Admission: RE | Admit: 2019-06-29 | Discharge: 2019-06-29 | Disposition: A | Discharge status: Home / Self Care | Payer: BC Managed Care – PPO | Source: Ambulatory Visit | Attending: Emergency Medicine | Admitting: Emergency Medicine

## 2019-06-29 DIAGNOSIS — K429 Umbilical hernia without obstruction or gangrene: Secondary | ICD-10-CM

## 2019-07-02 ENCOUNTER — Other Ambulatory Visit: Payer: Self-pay

## 2019-07-02 ENCOUNTER — Ambulatory Visit (HOSPITAL_COMMUNITY)
Admission: RE | Admit: 2019-07-02 | Discharge: 2019-07-02 | Disposition: A | Discharge status: Home / Self Care | Payer: BC Managed Care – PPO | Source: Ambulatory Visit | Attending: Emergency Medicine | Admitting: Emergency Medicine

## 2019-07-02 DIAGNOSIS — K429 Umbilical hernia without obstruction or gangrene: Secondary | ICD-10-CM | POA: Diagnosis not present

## 2019-07-03 ENCOUNTER — Encounter: Payer: Self-pay | Admitting: Emergency Medicine

## 2019-07-03 ENCOUNTER — Other Ambulatory Visit: Payer: Self-pay | Admitting: Emergency Medicine

## 2019-07-03 DIAGNOSIS — K429 Umbilical hernia without obstruction or gangrene: Secondary | ICD-10-CM

## 2020-02-15 ENCOUNTER — Ambulatory Visit: Payer: BC Managed Care – PPO | Attending: Internal Medicine

## 2020-08-23 IMAGING — US US ABDOMEN LIMITED
2 series · 14 of 14 positions shown · non-contrast
Comparison: None.

CLINICAL DATA: Soft tissue prominence at the umbilicus

EXAM:
ULTRASOUND ABDOMEN LIMITED/UMBILICAL REGION

[Series 1: us abdomen limited · 13 acquisitions, 13 frames shown (1 of 2)]
[im 1/13]
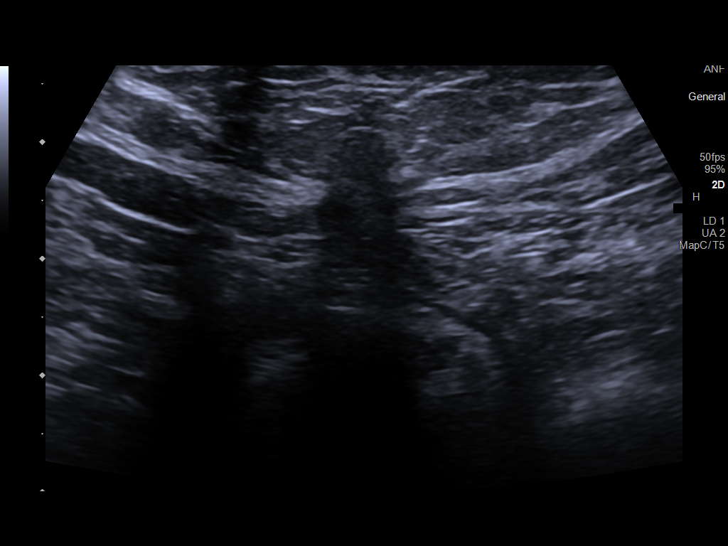
[im 2/13]
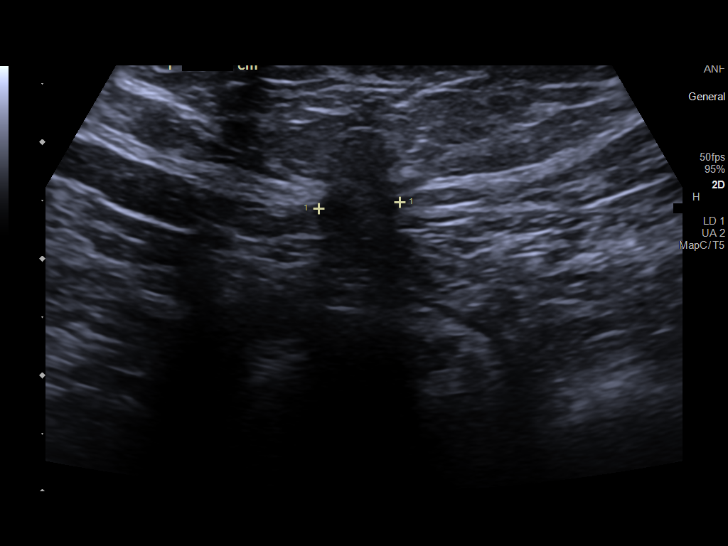
[im 3/13]
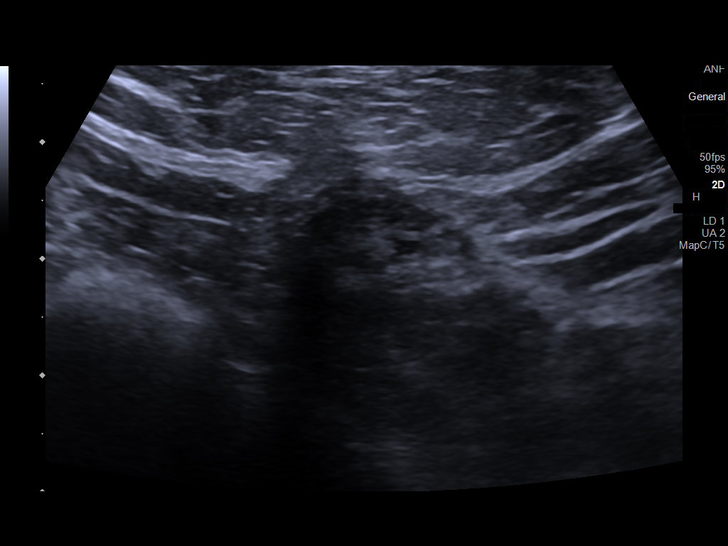
[im 4/13]
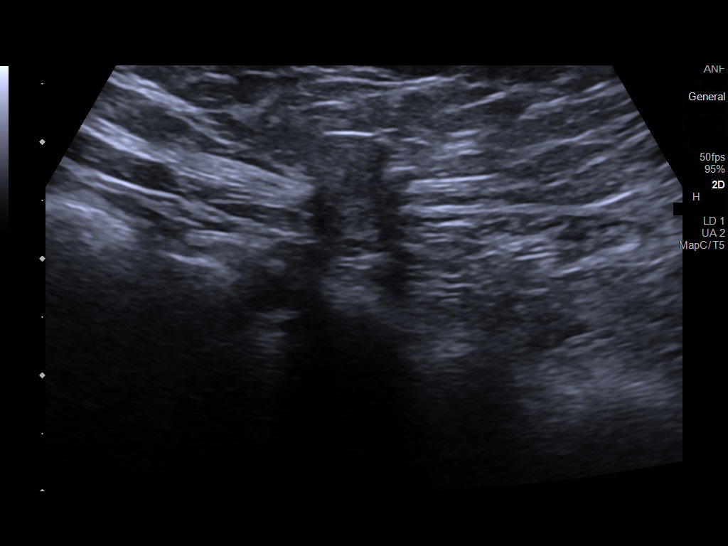
[im 5/13]
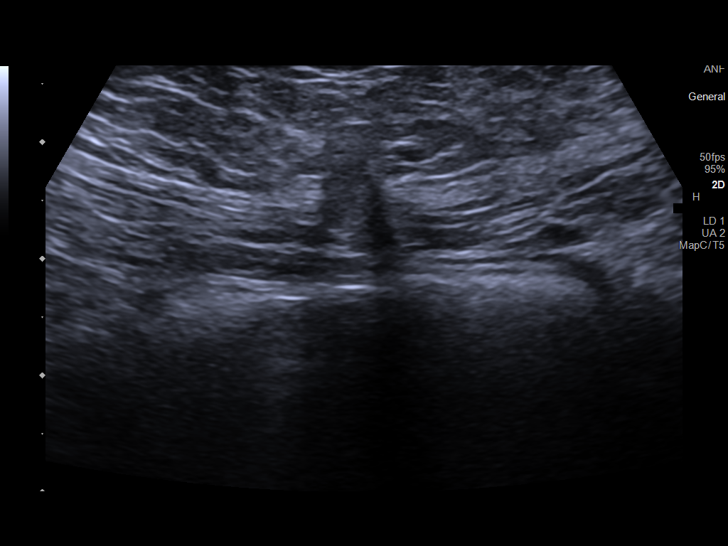
[im 6/13]
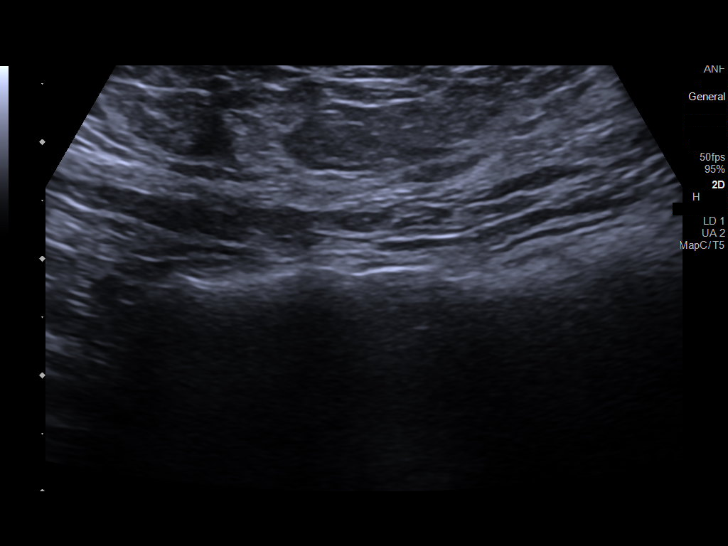
[im 7/13]
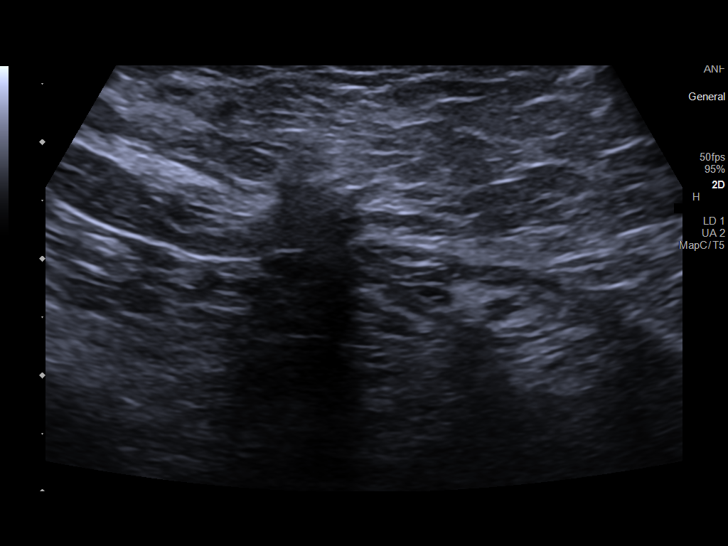
[im 8/13]
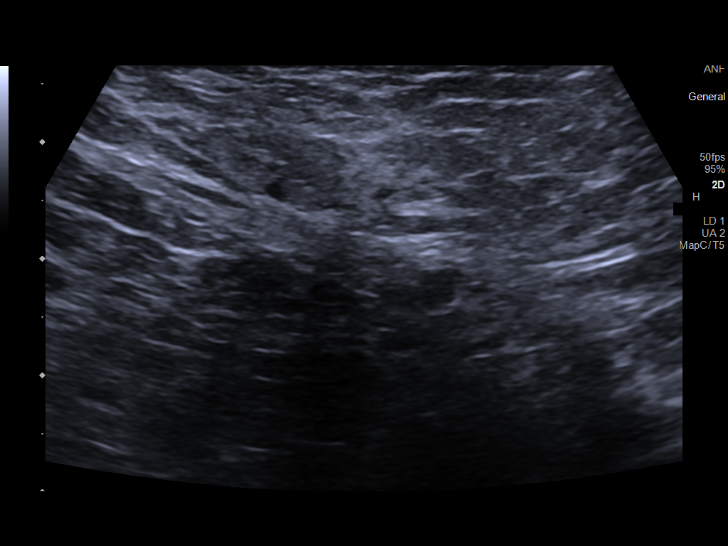
[im 9/13]
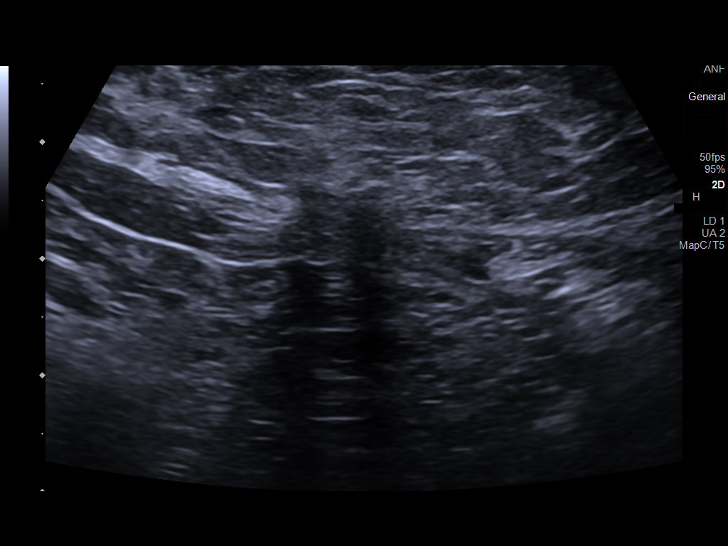
[im 10/13]
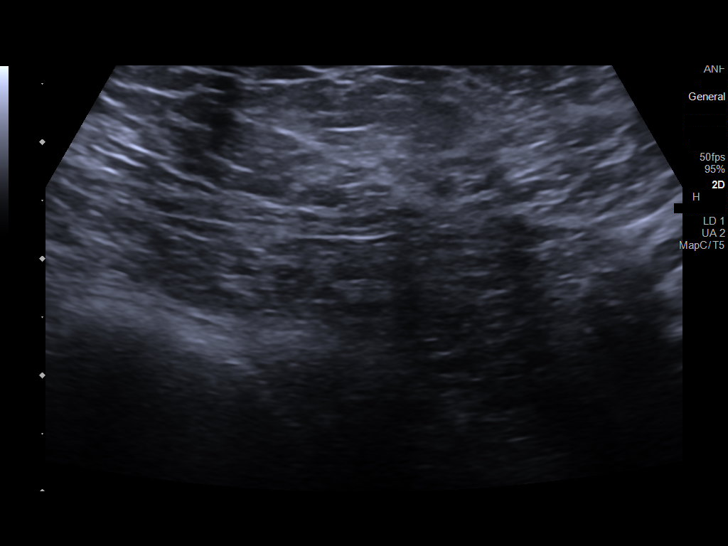
[im 11/13]
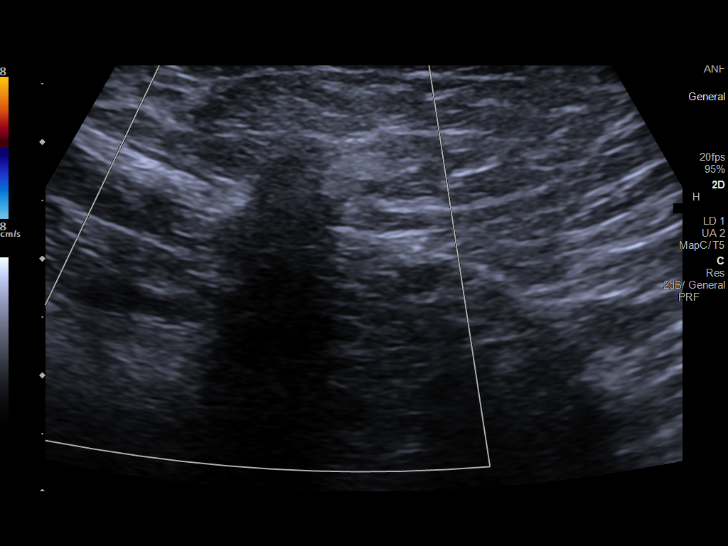
[im 12/13]
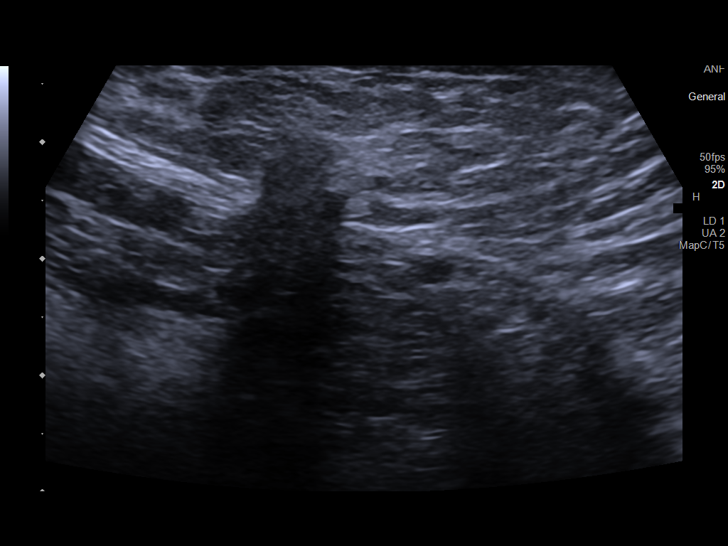
[im 13/13]
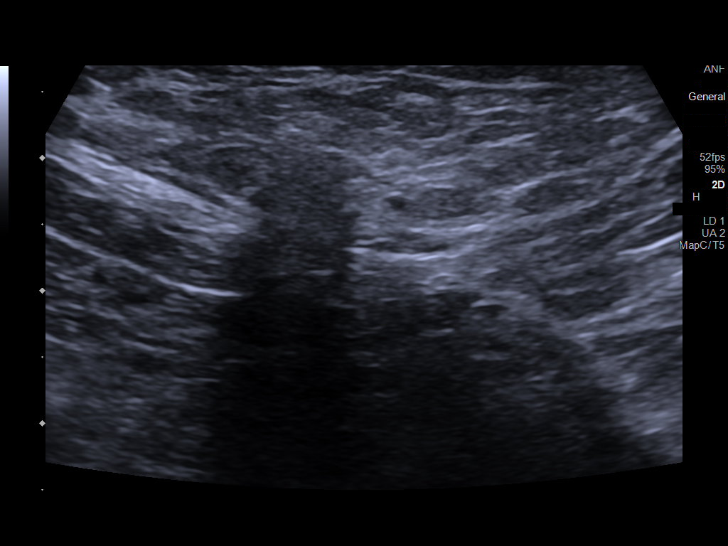

[Series 2: us abdomen limited · 1 of 1 slices shown (2 of 2)]
[im 1/1]
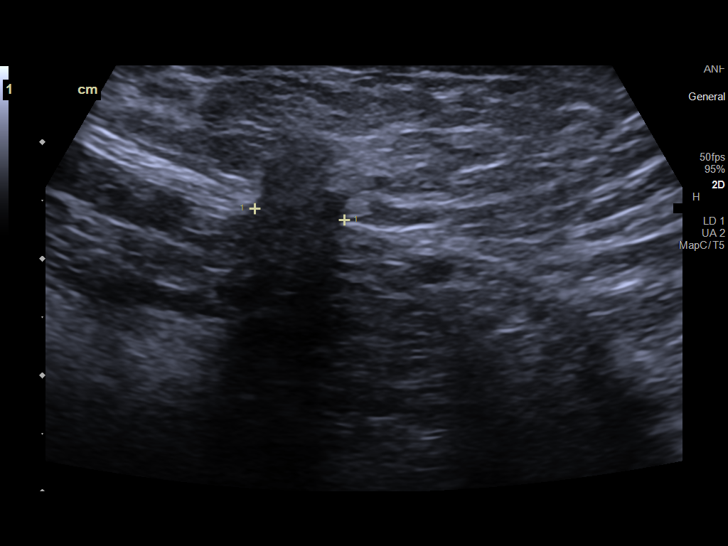

[14 of 14 positions shown; findings below may reference images not displayed]

FINDINGS: Transverse and longitudinal images were obtained at the level of the
umbilicus. At the level of the umbilicus, there is a focal hernia
containing fat but no bowel. The neck of the hernia measures
approximately 7 mm. There is no associated mass or fluid. No
inflammatory change evident.
IMPRESSION: Small umbilical hernia containing only fat. Study otherwise
unremarkable.

## 2022-02-03 ENCOUNTER — Emergency Department (HOSPITAL_BASED_OUTPATIENT_CLINIC_OR_DEPARTMENT_OTHER)
Admission: EM | Admit: 2022-02-03 | Discharge: 2022-02-03 | Disposition: A | Discharge status: Home / Self Care | Payer: BC Managed Care – PPO | Attending: Emergency Medicine | Admitting: Emergency Medicine

## 2022-02-03 ENCOUNTER — Encounter (HOSPITAL_BASED_OUTPATIENT_CLINIC_OR_DEPARTMENT_OTHER): Payer: Self-pay | Admitting: Emergency Medicine

## 2022-02-03 ENCOUNTER — Other Ambulatory Visit: Payer: Self-pay

## 2022-02-03 ENCOUNTER — Emergency Department (HOSPITAL_BASED_OUTPATIENT_CLINIC_OR_DEPARTMENT_OTHER): Payer: BC Managed Care – PPO | Admitting: Radiology

## 2022-02-03 DIAGNOSIS — G5603 Carpal tunnel syndrome, bilateral upper limbs: Secondary | ICD-10-CM | POA: Insufficient documentation

## 2022-02-03 DIAGNOSIS — M5416 Radiculopathy, lumbar region: Secondary | ICD-10-CM | POA: Diagnosis not present

## 2022-02-03 DIAGNOSIS — M545 Low back pain, unspecified: Secondary | ICD-10-CM | POA: Diagnosis present

## 2022-02-03 LAB — CBC WITH DIFFERENTIAL/PLATELET
Abs Immature Granulocytes: 0.01 10*3/uL (ref 0.00–0.07)
Basophils Absolute: 0 10*3/uL (ref 0.0–0.1)
Basophils Relative: 0 %
Eosinophils Absolute: 0.1 10*3/uL (ref 0.0–0.5)
Eosinophils Relative: 1 %
HCT: 35.5 % — ABNORMAL LOW (ref 36.0–46.0)
Hemoglobin: 11 g/dL — ABNORMAL LOW (ref 12.0–15.0)
Immature Granulocytes: 0 %
Lymphocytes Relative: 35 %
Lymphs Abs: 1.6 10*3/uL (ref 0.7–4.0)
MCH: 27.8 pg (ref 26.0–34.0)
MCHC: 31 g/dL (ref 30.0–36.0)
MCV: 89.9 fL (ref 80.0–100.0)
Monocytes Absolute: 0.3 10*3/uL (ref 0.1–1.0)
Monocytes Relative: 7 %
Neutro Abs: 2.5 10*3/uL (ref 1.7–7.7)
Neutrophils Relative %: 57 %
Platelets: 273 10*3/uL (ref 150–400)
RBC: 3.95 MIL/uL (ref 3.87–5.11)
RDW: 13.2 % (ref 11.5–15.5)
WBC: 4.5 10*3/uL (ref 4.0–10.5)
nRBC: 0 % (ref 0.0–0.2)

## 2022-02-03 LAB — COMPREHENSIVE METABOLIC PANEL
ALT: 13 U/L (ref 0–44)
AST: 15 U/L (ref 15–41)
Albumin: 4.6 g/dL (ref 3.5–5.0)
Alkaline Phosphatase: 47 U/L (ref 38–126)
Anion gap: 7 (ref 5–15)
BUN: 10 mg/dL (ref 6–20)
CO2: 26 mmol/L (ref 22–32)
Calcium: 9.7 mg/dL (ref 8.9–10.3)
Chloride: 104 mmol/L (ref 98–111)
Creatinine, Ser: 0.59 mg/dL (ref 0.44–1.00)
GFR, Estimated: >60 mL/min (ref >60)
Glucose, Bld: 81 mg/dL (ref 70–99)
Potassium: 3.9 mmol/L (ref 3.5–5.1)
Sodium: 137 mmol/L (ref 135–145)
Total Bilirubin: 0.4 mg/dL (ref 0.3–1.2)
Total Protein: 7.5 g/dL (ref 6.5–8.1)

## 2022-02-03 LAB — HCG, SERUM, QUALITATIVE: Preg, Serum: NEGATIVE

## 2022-02-03 NOTE — ED Triage Notes (Signed)
Multiple complaints , spinal pain from neck to lumbar area.  ?Today reports left leg pain , sciatica pain she said . X 1 day .  ?

## 2022-02-03 NOTE — ED Provider Notes (Addendum)
?[facility] ?Provider Note ? ? ?CSN: [id number] ?Arrival date & time: 02/03/22  1059 ? ?  ? ?History ? ?No chief complaint on file. ? ? ?Rhonda Shannon is a 46 y.o. female with multiple complaints today, but chief complaint is intermittent radiating shocklike pain up and down her left leg with accompanying back pain.  Chronic history of lower back pain.  Has been seeing chiropractic for treatment.  Is encouraged to take meloxicam for this and for chronic bilateral hand numbness, for which she was diagnosed with carpal tunnel bilaterally years ago.  Wears wrist braces during the day, but notes increasing hand numbness at night.  Does not wear the braces at night.  Does not take the meloxicam daily, maybe takes it once a week.  Also noted some lightheadedness/dizziness on Friday after work, which relieved itself with rest and has not felt that since then.  Denies active bilateral hand numbness/tingling or lightheadedness/dizziness.  Denies active lower back pain or shocklike pain of the left leg.  Denies recent injury, fever, hemoptysis, IV drug use, history of cancer treatment.  Denies abdominal pain, nausea, vomiting. ? ?The history is provided by the patient and medical records.  ? ?  ? ?Home Medications ?Prior to Admission medications   ?Medication Sig Start Date End Date Taking? Authorizing Provider  ?ALPRAZolam (XANAX) 0.5 MG tablet Take 1 tablet (0.5 mg total) by mouth daily as needed for anxiety. 06/22/19   Georgina Quint, MD  ?calcium carbonate (TUMS - DOSED IN MG ELEMENTAL CALCIUM) 500 MG chewable tablet Chew 1 tablet by mouth daily as needed for indigestion or heartburn.    [provider]  ?Cyanocobalamin (VITAMIN B 12 PO) Take 2,500 mg by mouth daily.    [provider]  ?ibuprofen (ADVIL,MOTRIN) 600 MG tablet Take 1 tablet (600 mg total) by mouth every 6 (six) hours as needed. 07/07/17   Osborn Coho, MD  ?OVER THE COUNTER MEDICATION      [provider]  ?oxyCODONE (OXY IR/ROXICODONE) 5 MG immediate release tablet Take 1 tablet (5 mg total) by mouth every 4 (four) hours as needed (pain scale 4-7). ?Patient not taking: Reported on 06/22/2019 06/05/18   Prothero, Henderson Newcomer, CNM  ?Prenatal Vit-Fe Fumarate-FA (PRENATAL MULTIVITAMIN) TABS tablet Take 1 tablet by mouth daily at 12 noon.    [provider]  ?   ? ?Allergies    ?Patient has no known allergies.   ? ?Review of Systems   ?Review of Systems  ?Musculoskeletal:  Positive for back pain.  ?     With shocklike left leg intermittent pain  ? ?Physical Exam ?Updated Vital Signs ?BP 118/72   Pulse 67   Temp 98.2 ?F (36.8 ?C)   Resp 20   Ht 5\' 4"  (1.626 m)   Wt 89.4 kg   LMP 01/17/2022 (Exact Date)   SpO2 99%   BMI 33.81 kg/m?  ?Physical Exam ?Vitals and nursing note reviewed.  ?Constitutional:   ?   General: She is not in acute distress. ?   Appearance: She is well-developed. She is not ill-appearing or diaphoretic.  ?HENT:  ?   Head: Normocephalic and atraumatic.  ?Eyes:  ?   Conjunctiva/sclera: Conjunctivae normal.  ?Cardiovascular:  ?   Rate and Rhythm: Normal rate and regular rhythm.  ?   Heart sounds: No murmur heard. ?   Comments: 2+ radial, DP, and PT pulses bilaterally.  No lower extremity edema.  CRT less than 2 seconds of  upper and lower extremities.  Regular rate and rhythm without murmurs rubs or gallops.  No evidence of ischemia. ?Pulmonary:  ?   Effort: Pulmonary effort is normal. No respiratory distress.  ?   Breath sounds: Normal breath sounds.  ?Abdominal:  ?   Palpations: Abdomen is soft.  ?   Tenderness: There is no abdominal tenderness.  ?Musculoskeletal:     ?   General: No swelling.  ?   Cervical back: Neck supple.  ?   Right lower leg: No edema.  ?   Left lower leg: No edema.  ?   Comments: No bony or paraspinal tenderness of cervical, thoracic, or lumbar region.  No obvious deformities or masses.  No rash, lesions, wounds, crepitus, ecchymoses, or edema  appreciated of the full spine.  No SI tenderness bilaterally.  No hip bursa or greater trochanteric tenderness bilaterally.    ?Skin: ?   General: Skin is warm and dry.  ?   Capillary Refill: Capillary refill takes less than 2 seconds.  ?   Findings: No bruising.  ?   Comments: No evidence of cellulitis or DVT.  ?Neurological:  ?   General: No focal deficit present.  ?   Mental Status: She is alert and oriented to person, place, and time.  ?   Sensory: No sensory deficit.  ?   Motor: No weakness.  ?   Gait: Gait normal.  ?   Deep Tendon Reflexes: Reflexes normal.  ?   Comments: Patellar and Achilles DTRs 2+ bilaterally  ?Psychiatric:     ?   Mood and Affect: Mood normal.  ? ? ?ED Results / Procedures / Treatments   ?Labs ?(all labs ordered are listed, but only abnormal results are displayed) ?Labs Reviewed  ?CBC WITH DIFFERENTIAL/PLATELET - Abnormal; Notable for the following components:  ?    Result Value  ? Hemoglobin 11.0 (*)   ? HCT 35.5 (*)   ? All other components within normal limits  ?COMPREHENSIVE METABOLIC PANEL  ?HCG, SERUM, QUALITATIVE  ? ? ?EKG ?None ? ?Radiology ?DG Lumbar Spine Complete ? ?Result Date: 02/03/2022 ?CLINICAL DATA:  Low back and possible radiculopathy. EXAM: LUMBAR SPINE - COMPLETE 4+ VIEW COMPARISON:  None. FINDINGS: Mild curvature of the lumbar spine appears convex towards the left. The vertebral body heights and the disc spaces appear well preserved. No fractures identified. Mild facet arthropathy within the lower lumbar spine. IMPRESSION: 1. No acute findings. 2. Mild curvature of the lumbar spine. 3. Mild lower lumbar facet arthropathy. Electronically Signed   By: Signa Kell M.D.   On: 02/03/2022 13:33   ? ?Procedures ?Procedures  ? ? ?Medications Ordered in ED ?Medications - No data to display ? ?ED Course/ Medical Decision Making/ A&P ?  ?                        ?Medical Decision Making ?Amount and/or Complexity of Data Reviewed ?External Data Reviewed: notes. ?Labs: ordered.  Decision-making details documented in ED Course. ?Radiology: ordered and independent interpretation performed. Decision-making details documented in ED Course. ?ECG/medicine tests:  Decision-making details documented in ED Course. ? ?Risk ?OTC drugs. ?Prescription drug management. ? ? ?46 y.o. female presents to the ED for concern of back pain with left leg nerve pain, dizziness, and bilateral hand numbness.  This involves an extensive number of treatment options, and is a complaint that carries with it a high risk of complications and morbidity.  The emergent differential  diagnosis prior to evaluation includes, but is not limited to: Vertigo, arrhythmia, encephalopathy, degenerative disc disease with or without radiculopathy, fracture, CVA, hypoglycemia, carpal tunnel, limb ischemia, compartment syndrome. ? ?This is not an exhaustive differential.  ? ?Past Medical History / Co-morbidities / Social History: ?Hx anxiety, tobacco use, dyslipidemia, anemia, depression ?Social Determinants of Health include tobacco use, for which cessation counseling was provided ? ?Additional History:  ?Internal and external records from outside source obtained and reviewed including internal medicine, family medicine ? ?Physical Exam: ?Physical exam performed. The pertinent findings include: Entire spine without bony or paraspinal tenderness.  No SI tenderness.  DTRs 2+ bilaterally of lower extremities.  No extremity edema.  No evidence of DVT, cellulitis, or ischemia.  Gait appears normal.  Sitting comfortably. ? ?Lab Tests: ?I ordered, and personally interpreted labs.  The pertinent results include:   ?CBC: Very mild anemia, otherwise unremarkable ?CMP/BMP: Unremarkable ?Pregnancy serum qualitative: Negative ? ?Imaging Studies: ?I ordered imaging studies including x-ray lumbar spine.  I independently visualized and interpreted said imaging.  Pertinent results include: ?No acute findings; Mild curvature of the lumbar spine; Mild  lower lumbar facet arthropathy ?I agree with the radiologist interpretation. ? ?I personally ordered and interpreted EKG 12 lead findings, which showed: Normal sinus rhythm ? ?Medications: ?None ? ?ED Course/Disposition: ?

## 2022-02-03 NOTE — Discharge Instructions (Addendum)
Your x-rays today were negative for acute fracture or dislocation.  You already have a prescription for meloxicam.  As discussed I would recommend taking this once a day for the next 1 to 2 weeks until you are able to follow-up with orthopedics. ? ?Also utilize stretching and resting your lower back.  You may also use Tylenol on top of this for additional pain management. ? ?You may utilize your carpal tunnel wrist braces as needed, not just in the day time or for work. ? ?Some additional information on pinched nerve and radicular type pain has been provided for you in your discharge paperwork.  Please review this at your leisure. ? ?Return to the ED for new or worsening symptoms as discussed. ?

## 2022-02-03 NOTE — ED Notes (Signed)
Discharge paperwork given and understood. 

## 2022-12-09 ENCOUNTER — Other Ambulatory Visit: Payer: Self-pay

## 2022-12-09 ENCOUNTER — Other Ambulatory Visit: Payer: Self-pay | Admitting: Obstetrics and Gynecology

## 2022-12-09 DIAGNOSIS — N920 Excessive and frequent menstruation with regular cycle: Secondary | ICD-10-CM

## 2023-01-03 ENCOUNTER — Other Ambulatory Visit: Payer: BC Managed Care – PPO
